# Patient Record
Sex: Female | Born: 1950 | Race: White | Hispanic: No | Smoking: Never smoker
Health system: Southern US, Community
[De-identification: ages and names within clinical notes are randomized; demographics above are authoritative.]

## PROBLEM LIST (undated history)

## (undated) DIAGNOSIS — R51 Headache: Secondary | ICD-10-CM

## (undated) DIAGNOSIS — F4321 Adjustment disorder with depressed mood: Secondary | ICD-10-CM

## (undated) DIAGNOSIS — D649 Anemia, unspecified: Secondary | ICD-10-CM

## (undated) DIAGNOSIS — K589 Irritable bowel syndrome without diarrhea: Secondary | ICD-10-CM

## (undated) DIAGNOSIS — R7303 Prediabetes: Secondary | ICD-10-CM

## (undated) DIAGNOSIS — C4441 Basal cell carcinoma of skin of scalp and neck: Secondary | ICD-10-CM

## (undated) DIAGNOSIS — J302 Other seasonal allergic rhinitis: Secondary | ICD-10-CM

## (undated) DIAGNOSIS — Z8739 Personal history of other diseases of the musculoskeletal system and connective tissue: Secondary | ICD-10-CM

## (undated) DIAGNOSIS — K581 Irritable bowel syndrome with constipation: Secondary | ICD-10-CM

## (undated) DIAGNOSIS — E785 Hyperlipidemia, unspecified: Secondary | ICD-10-CM

## (undated) DIAGNOSIS — K219 Gastro-esophageal reflux disease without esophagitis: Secondary | ICD-10-CM

## (undated) DIAGNOSIS — I10 Essential (primary) hypertension: Secondary | ICD-10-CM

## (undated) DIAGNOSIS — H3530 Unspecified macular degeneration: Secondary | ICD-10-CM

## (undated) DIAGNOSIS — R519 Headache, unspecified: Secondary | ICD-10-CM

## (undated) DIAGNOSIS — R011 Cardiac murmur, unspecified: Secondary | ICD-10-CM

## (undated) DIAGNOSIS — C539 Malignant neoplasm of cervix uteri, unspecified: Secondary | ICD-10-CM

## (undated) HISTORY — PX: BASAL CELL CARCINOMA EXCISION: SHX1214

## (undated) HISTORY — PX: EYE SURGERY: SHX253

## (undated) HISTORY — PX: COLONOSCOPY: SHX174

## (undated) HISTORY — PX: VAGINAL HYSTERECTOMY: SUR661

## (undated) HISTORY — PX: DILATION AND CURETTAGE OF UTERUS: SHX78

---

## 2005-04-18 ENCOUNTER — Ambulatory Visit: Payer: Self-pay | Admitting: Gastroenterology

## 2005-05-10 ENCOUNTER — Ambulatory Visit: Payer: Self-pay | Admitting: Gastroenterology

## 2005-11-20 ENCOUNTER — Ambulatory Visit: Payer: Self-pay | Admitting: Internal Medicine

## 2006-11-27 ENCOUNTER — Ambulatory Visit: Payer: Self-pay | Admitting: Internal Medicine

## 2007-12-17 ENCOUNTER — Ambulatory Visit: Payer: Self-pay | Admitting: Internal Medicine

## 2008-06-18 ENCOUNTER — Ambulatory Visit: Payer: Self-pay | Admitting: Internal Medicine

## 2008-06-24 ENCOUNTER — Ambulatory Visit: Payer: Self-pay | Admitting: Internal Medicine

## 2008-12-22 ENCOUNTER — Ambulatory Visit: Payer: Self-pay | Admitting: Internal Medicine

## 2010-01-30 ENCOUNTER — Ambulatory Visit: Payer: Self-pay | Admitting: Internal Medicine

## 2010-02-09 ENCOUNTER — Ambulatory Visit: Payer: Self-pay | Admitting: Internal Medicine

## 2011-03-15 ENCOUNTER — Ambulatory Visit: Payer: Self-pay | Admitting: Internal Medicine

## 2013-07-13 ENCOUNTER — Ambulatory Visit: Payer: Self-pay | Admitting: Internal Medicine

## 2014-12-02 DIAGNOSIS — E7849 Other hyperlipidemia: Secondary | ICD-10-CM | POA: Insufficient documentation

## 2014-12-02 DIAGNOSIS — I10 Essential (primary) hypertension: Secondary | ICD-10-CM | POA: Insufficient documentation

## 2015-01-06 ENCOUNTER — Other Ambulatory Visit: Payer: Self-pay | Admitting: Internal Medicine

## 2015-01-06 DIAGNOSIS — Z1231 Encounter for screening mammogram for malignant neoplasm of breast: Secondary | ICD-10-CM

## 2015-01-21 ENCOUNTER — Ambulatory Visit: Admission: RE | Admit: 2015-01-21 | Payer: Self-pay | Source: Ambulatory Visit

## 2015-04-29 ENCOUNTER — Ambulatory Visit
Admission: RE | Admit: 2015-04-29 | Discharge: 2015-04-29 | Disposition: A | Discharge status: Home / Self Care | Payer: BLUE CROSS/BLUE SHIELD | Source: Ambulatory Visit | Attending: Internal Medicine | Admitting: Internal Medicine

## 2015-04-29 DIAGNOSIS — Z1231 Encounter for screening mammogram for malignant neoplasm of breast: Secondary | ICD-10-CM | POA: Diagnosis not present

## 2015-10-04 ENCOUNTER — Encounter (INDEPENDENT_AMBULATORY_CARE_PROVIDER_SITE_OTHER): Payer: BLUE CROSS/BLUE SHIELD | Admitting: Ophthalmology

## 2015-10-04 DIAGNOSIS — H353131 Nonexudative age-related macular degeneration, bilateral, early dry stage: Secondary | ICD-10-CM | POA: Diagnosis not present

## 2015-10-04 DIAGNOSIS — H35342 Macular cyst, hole, or pseudohole, left eye: Secondary | ICD-10-CM

## 2015-10-04 DIAGNOSIS — I10 Essential (primary) hypertension: Secondary | ICD-10-CM | POA: Diagnosis not present

## 2015-10-04 DIAGNOSIS — H2513 Age-related nuclear cataract, bilateral: Secondary | ICD-10-CM

## 2015-10-04 DIAGNOSIS — H35373 Puckering of macula, bilateral: Secondary | ICD-10-CM

## 2015-10-04 DIAGNOSIS — H43813 Vitreous degeneration, bilateral: Secondary | ICD-10-CM

## 2015-10-04 DIAGNOSIS — H35033 Hypertensive retinopathy, bilateral: Secondary | ICD-10-CM | POA: Diagnosis not present

## 2015-10-05 NOTE — H&P (Signed)
Julia Noble is an 65 y.o. female.   Chief Complaint:Painless loss of vision left eye HPI: Progressive vision loss left eye over two months  Past Medical History  Diagnosis Date  . Cancer     cervical ca    Past Surgical History  Procedure Laterality Date  . Abdominal hysterectomy      Family History  Problem Relation Age of Onset  . Breast cancer Mother 3   Social History:  has no tobacco, alcohol, and drug history on file.  Allergies: Allergies not on file  No prescriptions prior to admission    Review of systems otherwise negative  There were no vitals taken for this visit.  Physical exam: Mental status: oriented x3. Eyes: See eye exam associated with this date of surgery in media tab.  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  [name] 10/05/2015, 7:18 AM

## 2015-10-12 ENCOUNTER — Encounter (INDEPENDENT_AMBULATORY_CARE_PROVIDER_SITE_OTHER): Payer: Self-pay | Admitting: Ophthalmology

## 2015-10-31 ENCOUNTER — Encounter (HOSPITAL_COMMUNITY): Payer: Self-pay | Admitting: *Deleted

## 2015-10-31 NOTE — Progress Notes (Signed)
Pt states she had a heart murmur when she was younger, never had any problems with it. Denies recent chest pain or sob.

## 2015-11-01 ENCOUNTER — Encounter (HOSPITAL_COMMUNITY): Payer: Self-pay | Admitting: General Practice

## 2015-11-01 ENCOUNTER — Ambulatory Visit (HOSPITAL_COMMUNITY): Payer: BLUE CROSS/BLUE SHIELD | Admitting: Anesthesiology

## 2015-11-01 ENCOUNTER — Encounter (HOSPITAL_COMMUNITY)
Admission: RE | Disposition: A | Discharge status: Home / Self Care | Payer: Self-pay | Source: Ambulatory Visit | Attending: Ophthalmology

## 2015-11-01 ENCOUNTER — Ambulatory Visit (HOSPITAL_COMMUNITY)
Admission: RE | Admit: 2015-11-01 | Discharge: 2015-11-02 | Disposition: A | Discharge status: Home / Self Care | Payer: BLUE CROSS/BLUE SHIELD | Source: Ambulatory Visit | Attending: Ophthalmology | Admitting: Ophthalmology

## 2015-11-01 DIAGNOSIS — K219 Gastro-esophageal reflux disease without esophagitis: Secondary | ICD-10-CM | POA: Diagnosis not present

## 2015-11-01 DIAGNOSIS — I10 Essential (primary) hypertension: Secondary | ICD-10-CM | POA: Diagnosis not present

## 2015-11-01 DIAGNOSIS — M1990 Unspecified osteoarthritis, unspecified site: Secondary | ICD-10-CM | POA: Insufficient documentation

## 2015-11-01 DIAGNOSIS — F329 Major depressive disorder, single episode, unspecified: Secondary | ICD-10-CM | POA: Insufficient documentation

## 2015-11-01 DIAGNOSIS — H35342 Macular cyst, hole, or pseudohole, left eye: Secondary | ICD-10-CM | POA: Diagnosis not present

## 2015-11-01 DIAGNOSIS — Z79899 Other long term (current) drug therapy: Secondary | ICD-10-CM | POA: Insufficient documentation

## 2015-11-01 DIAGNOSIS — H35349 Macular cyst, hole, or pseudohole, unspecified eye: Secondary | ICD-10-CM | POA: Diagnosis present

## 2015-11-01 HISTORY — DX: Cardiac murmur, unspecified: R01.1

## 2015-11-01 HISTORY — PX: MEMBRANE PEEL: SHX5967

## 2015-11-01 HISTORY — DX: Hyperlipidemia, unspecified: E78.5

## 2015-11-01 HISTORY — DX: Malignant neoplasm of cervix uteri, unspecified: C53.9

## 2015-11-01 HISTORY — DX: Irritable bowel syndrome, unspecified: K58.9

## 2015-11-01 HISTORY — DX: Unspecified macular degeneration: H35.30

## 2015-11-01 HISTORY — PX: 25 GAUGE PARS PLANA VITRECTOMY WITH 20 GAUGE MVR PORT FOR MACULAR HOLE: SHX6096

## 2015-11-01 HISTORY — DX: Adjustment disorder with depressed mood: F43.21

## 2015-11-01 HISTORY — DX: Headache: R51

## 2015-11-01 HISTORY — DX: Headache, unspecified: R51.9

## 2015-11-01 HISTORY — PX: PARS PLANA VITRECTOMY: SHX2166

## 2015-11-01 HISTORY — DX: Gastro-esophageal reflux disease without esophagitis: K21.9

## 2015-11-01 HISTORY — DX: Anemia, unspecified: D64.9

## 2015-11-01 HISTORY — DX: Basal cell carcinoma of skin of scalp and neck: C44.41

## 2015-11-01 HISTORY — DX: Essential (primary) hypertension: I10

## 2015-11-01 HISTORY — DX: Personal history of other diseases of the musculoskeletal system and connective tissue: Z87.39

## 2015-11-01 LAB — AUTOLOGOUS SERUM PATCH PREP

## 2015-11-01 LAB — BASIC METABOLIC PANEL
Anion gap: 11 (ref 5–15)
BUN: 18 mg/dL (ref 6–20)
CALCIUM: 9.7 mg/dL (ref 8.9–10.3)
CO2: 27 mmol/L (ref 22–32)
CREATININE: 0.83 mg/dL (ref 0.44–1.00)
Chloride: 102 mmol/L (ref 101–111)
Glucose, Bld: 101 mg/dL — ABNORMAL HIGH (ref 65–99)
Potassium: 3.3 mmol/L — ABNORMAL LOW (ref 3.5–5.1)
SODIUM: 140 mmol/L (ref 135–145)

## 2015-11-01 LAB — ABO/RH: ABO/RH(D): O POS

## 2015-11-01 SURGERY — 25 GAUGE PARS PLANA VITRECTOMY WITH 20 GAUGE MVR PORT FOR MACULAR HOLE
Anesthesia: General | Site: Eye | Laterality: Left

## 2015-11-01 MED ORDER — TROPICAMIDE 1 % OP SOLN
OPHTHALMIC | Status: AC
  Filled 2015-11-01: qty 3

## 2015-11-01 MED ORDER — 0.9 % SODIUM CHLORIDE (POUR BTL) OPTIME
TOPICAL | Status: DC | PRN
  Administered 2015-11-01: 200 mL

## 2015-11-01 MED ORDER — LIDOCAINE HCL (CARDIAC) 20 MG/ML IV SOLN
INTRAVENOUS | Status: AC
  Filled 2015-11-01: qty 5

## 2015-11-01 MED ORDER — IBUPROFEN 200 MG PO TABS: 200.0000 mg | ORAL_TABLET | Freq: Every day | ORAL | Status: DC | PRN

## 2015-11-01 MED ORDER — CEFTAZIDIME 1 G IJ SOLR: INTRAMUSCULAR | Status: DC | PRN

## 2015-11-01 MED ORDER — EPINEPHRINE HCL 1 MG/ML IJ SOLN
INTRAOCULAR | Status: DC | PRN
  Administered 2015-11-01: 11:00:00

## 2015-11-01 MED ORDER — ROCURONIUM BROMIDE 100 MG/10ML IV SOLN
INTRAVENOUS | Status: DC | PRN
  Administered 2015-11-01: 30 mg via INTRAVENOUS
  Administered 2015-11-01: 5 mg via INTRAVENOUS

## 2015-11-01 MED ORDER — GATIFLOXACIN 0.5 % OP SOLN
1.0000 [drp] | Freq: Four times a day (QID) | OPHTHALMIC | Status: DC
  Filled 2015-11-01: qty 2.5

## 2015-11-01 MED ORDER — PROPOFOL 10 MG/ML IV BOLUS
INTRAVENOUS | Status: AC
  Filled 2015-11-01: qty 20

## 2015-11-01 MED ORDER — PREDNISOLONE ACETATE 1 % OP SUSP
1.0000 [drp] | Freq: Four times a day (QID) | OPHTHALMIC | Status: DC
  Filled 2015-11-01: qty 5

## 2015-11-01 MED ORDER — NAPROXEN SODIUM 275 MG PO TABS: 275.0000 mg | ORAL_TABLET | Freq: Every day | ORAL | Status: DC | PRN

## 2015-11-01 MED ORDER — BACITRACIN-POLYMYXIN B 500-10000 UNIT/GM OP OINT
1.0000 "application " | TOPICAL_OINTMENT | Freq: Three times a day (TID) | OPHTHALMIC | Status: DC
  Filled 2015-11-01: qty 3.5

## 2015-11-01 MED ORDER — ACETAMINOPHEN 500 MG PO TABS: 500.0000 mg | ORAL_TABLET | Freq: Every day | ORAL | Status: DC | PRN

## 2015-11-01 MED ORDER — SERTRALINE HCL 50 MG PO TABS: 50.0000 mg | ORAL_TABLET | ORAL | Status: DC

## 2015-11-01 MED ORDER — CEFAZOLIN SODIUM-DEXTROSE 2-3 GM-% IV SOLR
2.0000 g | INTRAVENOUS | Status: AC
  Administered 2015-11-01: 2 g via INTRAVENOUS
  Filled 2015-11-01: qty 50

## 2015-11-01 MED ORDER — PHENYLEPHRINE HCL 2.5 % OP SOLN: 1.0000 [drp] | OPHTHALMIC | Status: DC | PRN

## 2015-11-01 MED ORDER — GATIFLOXACIN 0.5 % OP SOLN: 1.0000 [drp] | OPHTHALMIC | Status: DC | PRN

## 2015-11-01 MED ORDER — MIDAZOLAM HCL 2 MG/2ML IJ SOLN
INTRAMUSCULAR | Status: DC | PRN
  Administered 2015-11-01: 2 mg via INTRAVENOUS

## 2015-11-01 MED ORDER — CYCLOPENTOLATE HCL 1 % OP SOLN
OPHTHALMIC | Status: AC
  Filled 2015-11-01: qty 2

## 2015-11-01 MED ORDER — LIDOCAINE HCL (CARDIAC) 20 MG/ML IV SOLN
INTRAVENOUS | Status: DC | PRN
  Administered 2015-11-01: 30 mg via INTRAVENOUS

## 2015-11-01 MED ORDER — POLYMYXIN B SULFATE 500000 UNITS IJ SOLR
INTRAMUSCULAR | Status: AC
  Filled 2015-11-01: qty 1

## 2015-11-01 MED ORDER — GATIFLOXACIN 0.5 % OP SOLN
1.0000 [drp] | OPHTHALMIC | Status: AC
  Administered 2015-11-01 (×3): 1 [drp] via OPHTHALMIC

## 2015-11-01 MED ORDER — AMLODIPINE BESYLATE 5 MG PO TABS
5.0000 mg | ORAL_TABLET | Freq: Every day | ORAL | Status: DC
  Administered 2015-11-01: 5 mg via ORAL
  Filled 2015-11-01: qty 1

## 2015-11-01 MED ORDER — HYDROMORPHONE HCL 1 MG/ML IJ SOLN: 0.2500 mg | INTRAMUSCULAR | Status: DC | PRN

## 2015-11-01 MED ORDER — CEFTAZIDIME 1 G IJ SOLR
INTRAMUSCULAR | Status: AC
  Filled 2015-11-01: qty 1

## 2015-11-01 MED ORDER — PHENYLEPHRINE HCL 10 MG/ML IJ SOLN
INTRAMUSCULAR | Status: DC | PRN
  Administered 2015-11-01: 40 ug via INTRAVENOUS
  Administered 2015-11-01: 80 ug via INTRAVENOUS
  Administered 2015-11-01: 40 ug via INTRAVENOUS
  Administered 2015-11-01 (×3): 80 ug via INTRAVENOUS

## 2015-11-01 MED ORDER — SUGAMMADEX SODIUM 200 MG/2ML IV SOLN
INTRAVENOUS | Status: AC
  Filled 2015-11-01: qty 2

## 2015-11-01 MED ORDER — EPINEPHRINE HCL 1 MG/ML IJ SOLN
INTRAMUSCULAR | Status: AC
  Filled 2015-11-01: qty 1

## 2015-11-01 MED ORDER — ACETAMINOPHEN 325 MG PO TABS: 325.0000 mg | ORAL_TABLET | ORAL | Status: DC | PRN

## 2015-11-01 MED ORDER — BRIMONIDINE TARTRATE 0.2 % OP SOLN
1.0000 [drp] | Freq: Two times a day (BID) | OPHTHALMIC | Status: DC
  Filled 2015-11-01: qty 5

## 2015-11-01 MED ORDER — ADULT MULTIVITAMIN W/MINERALS CH: 1.0000 | ORAL_TABLET | Freq: Every day | ORAL | Status: DC

## 2015-11-01 MED ORDER — ONDANSETRON HCL 4 MG/2ML IJ SOLN
INTRAMUSCULAR | Status: AC
  Filled 2015-11-01: qty 2

## 2015-11-01 MED ORDER — ACETAZOLAMIDE SODIUM 500 MG IJ SOLR
500.0000 mg | Freq: Once | INTRAMUSCULAR | Status: AC
  Administered 2015-11-02: 500 mg via INTRAVENOUS
  Filled 2015-11-01: qty 500

## 2015-11-01 MED ORDER — SODIUM CHLORIDE 0.9 % IV SOLN
INTRAVENOUS | Status: DC
  Administered 2015-11-01 (×2): via INTRAVENOUS

## 2015-11-01 MED ORDER — PANTOPRAZOLE SODIUM 40 MG PO TBEC: 40.0000 mg | DELAYED_RELEASE_TABLET | Freq: Every day | ORAL | Status: DC

## 2015-11-01 MED ORDER — FENTANYL CITRATE (PF) 250 MCG/5ML IJ SOLN
INTRAMUSCULAR | Status: AC
  Filled 2015-11-01: qty 5

## 2015-11-01 MED ORDER — PHENYLEPHRINE 40 MCG/ML (10ML) SYRINGE FOR IV PUSH (FOR BLOOD PRESSURE SUPPORT)
PREFILLED_SYRINGE | INTRAVENOUS | Status: AC
  Filled 2015-11-01: qty 10

## 2015-11-01 MED ORDER — CYCLOPENTOLATE HCL 1 % OP SOLN
1.0000 [drp] | OPHTHALMIC | Status: AC
  Administered 2015-11-01 (×3): 1 [drp] via OPHTHALMIC

## 2015-11-01 MED ORDER — SIMVASTATIN 20 MG PO TABS
20.0000 mg | ORAL_TABLET | Freq: Every day | ORAL | Status: DC
  Administered 2015-11-01: 20 mg via ORAL
  Filled 2015-11-01: qty 1

## 2015-11-01 MED ORDER — PROPOFOL 10 MG/ML IV BOLUS
INTRAVENOUS | Status: DC | PRN
  Administered 2015-11-01: 160 mg via INTRAVENOUS

## 2015-11-01 MED ORDER — MIDAZOLAM HCL 2 MG/2ML IJ SOLN
INTRAMUSCULAR | Status: AC
  Filled 2015-11-01: qty 2

## 2015-11-01 MED ORDER — TEMAZEPAM 15 MG PO CAPS: 15.0000 mg | ORAL_CAPSULE | Freq: Every evening | ORAL | Status: DC | PRN

## 2015-11-01 MED ORDER — ATROPINE SULFATE 1 % OP SOLN
OPHTHALMIC | Status: DC | PRN
  Administered 2015-11-01: 1 [drp] via OPHTHALMIC

## 2015-11-01 MED ORDER — CYCLOPENTOLATE HCL 1 % OP SOLN: 1.0000 [drp] | OPHTHALMIC | Status: DC | PRN

## 2015-11-01 MED ORDER — BUPIVACAINE HCL (PF) 0.75 % IJ SOLN
INTRAMUSCULAR | Status: AC
  Filled 2015-11-01: qty 10

## 2015-11-01 MED ORDER — SODIUM HYALURONATE 10 MG/ML IO SOLN
INTRAOCULAR | Status: AC
Start: 2015-11-01 — End: 2015-11-01
  Filled 2015-11-01: qty 0.85

## 2015-11-01 MED ORDER — HYDROCODONE-ACETAMINOPHEN 5-325 MG PO TABS
1.0000 | ORAL_TABLET | ORAL | Status: DC | PRN
  Administered 2015-11-01: 2 via ORAL
  Filled 2015-11-01: qty 2

## 2015-11-01 MED ORDER — STERILE WATER FOR INJECTION IJ SOLN
INTRAMUSCULAR | Status: AC
  Filled 2015-11-01: qty 20

## 2015-11-01 MED ORDER — TETRACAINE HCL 0.5 % OP SOLN
2.0000 [drp] | Freq: Once | OPHTHALMIC | Status: DC
  Filled 2015-11-01: qty 2

## 2015-11-01 MED ORDER — ATROPINE SULFATE 1 % OP SOLN
OPHTHALMIC | Status: AC
  Filled 2015-11-01: qty 5

## 2015-11-01 MED ORDER — ROCURONIUM BROMIDE 50 MG/5ML IV SOLN
INTRAVENOUS | Status: AC
  Filled 2015-11-01: qty 1

## 2015-11-01 MED ORDER — STERILE WATER FOR INJECTION IJ SOLN
INTRAMUSCULAR | Status: DC | PRN
  Administered 2015-11-01: 20 mL

## 2015-11-01 MED ORDER — BSS PLUS IO SOLN
INTRAOCULAR | Status: AC
  Filled 2015-11-01: qty 500

## 2015-11-01 MED ORDER — HYPROMELLOSE (GONIOSCOPIC) 2.5 % OP SOLN
OPHTHALMIC | Status: AC
  Filled 2015-11-01: qty 15

## 2015-11-01 MED ORDER — LATANOPROST 0.005 % OP SOLN
1.0000 [drp] | Freq: Every day | OPHTHALMIC | Status: DC
  Filled 2015-11-01 (×2): qty 2.5

## 2015-11-01 MED ORDER — FENTANYL CITRATE (PF) 250 MCG/5ML IJ SOLN
INTRAMUSCULAR | Status: DC | PRN
  Administered 2015-11-01: 50 ug via INTRAVENOUS

## 2015-11-01 MED ORDER — SODIUM HYALURONATE 10 MG/ML IO SOLN
INTRAOCULAR | Status: DC | PRN
  Administered 2015-11-01: 0.85 mL via INTRAOCULAR

## 2015-11-01 MED ORDER — SODIUM CHLORIDE 0.45 % IV SOLN
INTRAVENOUS | Status: DC
  Administered 2015-11-01: 100 mL/h via INTRAVENOUS

## 2015-11-01 MED ORDER — PHENYLEPHRINE HCL 2.5 % OP SOLN
OPHTHALMIC | Status: AC
  Filled 2015-11-01: qty 2

## 2015-11-01 MED ORDER — GLYCOPYRROLATE 0.2 MG/ML IJ SOLN
INTRAMUSCULAR | Status: DC | PRN
  Administered 2015-11-01: 0.2 mg via INTRAVENOUS

## 2015-11-01 MED ORDER — TROPICAMIDE 1 % OP SOLN
1.0000 [drp] | OPHTHALMIC | Status: AC
  Administered 2015-11-01 (×3): 1 [drp] via OPHTHALMIC

## 2015-11-01 MED ORDER — BSS IO SOLN
INTRAOCULAR | Status: AC
  Filled 2015-11-01: qty 15

## 2015-11-01 MED ORDER — ALPRAZOLAM 0.5 MG PO TABS: 0.5000 mg | ORAL_TABLET | Freq: Every day | ORAL | Status: DC | PRN

## 2015-11-01 MED ORDER — TRIAMTERENE-HCTZ 37.5-25 MG PO TABS
1.0000 | ORAL_TABLET | Freq: Every day | ORAL | Status: DC
  Administered 2015-11-01: 1 via ORAL
  Filled 2015-11-01 (×4): qty 1

## 2015-11-01 MED ORDER — BACITRACIN-POLYMYXIN B 500-10000 UNIT/GM OP OINT
TOPICAL_OINTMENT | OPHTHALMIC | Status: AC
  Filled 2015-11-01: qty 3.5

## 2015-11-01 MED ORDER — PHENYLEPHRINE HCL 2.5 % OP SOLN
1.0000 [drp] | OPHTHALMIC | Status: AC
  Administered 2015-11-01 (×3): 1 [drp] via OPHTHALMIC

## 2015-11-01 MED ORDER — HEMOSTATIC AGENTS (NO CHARGE) OPTIME
TOPICAL | Status: DC | PRN
  Administered 2015-11-01: 1 via TOPICAL

## 2015-11-01 MED ORDER — DEXAMETHASONE SODIUM PHOSPHATE 10 MG/ML IJ SOLN
INTRAMUSCULAR | Status: DC | PRN
  Administered 2015-11-01: 10 mg

## 2015-11-01 MED ORDER — DEXAMETHASONE SODIUM PHOSPHATE 10 MG/ML IJ SOLN
INTRAMUSCULAR | Status: AC
  Filled 2015-11-01: qty 1

## 2015-11-01 MED ORDER — MORPHINE SULFATE (PF) 2 MG/ML IV SOLN: 1.0000 mg | INTRAVENOUS | Status: DC | PRN

## 2015-11-01 MED ORDER — TRIAMCINOLONE ACETONIDE 40 MG/ML IJ SUSP
INTRAMUSCULAR | Status: AC
  Filled 2015-11-01: qty 5

## 2015-11-01 MED ORDER — MAGNESIUM HYDROXIDE 400 MG/5ML PO SUSP: 15.0000 mL | Freq: Four times a day (QID) | ORAL | Status: DC | PRN

## 2015-11-01 MED ORDER — DOUBLE ANTIBIOTIC 500-10000 UNIT/GM EX OINT
TOPICAL_OINTMENT | CUTANEOUS | Status: AC
  Filled 2015-11-01: qty 1

## 2015-11-01 MED ORDER — GATIFLOXACIN 0.5 % OP SOLN
OPHTHALMIC | Status: AC
  Filled 2015-11-01: qty 2.5

## 2015-11-01 MED ORDER — SUGAMMADEX SODIUM 200 MG/2ML IV SOLN
INTRAVENOUS | Status: DC | PRN
  Administered 2015-11-01: 130 mg via INTRAVENOUS

## 2015-11-01 MED ORDER — BUPIVACAINE HCL (PF) 0.75 % IJ SOLN
INTRAMUSCULAR | Status: DC | PRN
Start: 2015-11-01 — End: 2015-11-01
  Administered 2015-11-01: 10 mL

## 2015-11-01 MED ORDER — BUPIVACAINE-EPINEPHRINE (PF) 0.25% -1:200000 IJ SOLN
INTRAMUSCULAR | Status: AC
  Filled 2015-11-01: qty 30

## 2015-11-01 MED ORDER — TROPICAMIDE 1 % OP SOLN: 1.0000 [drp] | OPHTHALMIC | Status: DC | PRN

## 2015-11-01 MED ORDER — BACITRACIN-POLYMYXIN B 500-10000 UNIT/GM OP OINT
TOPICAL_OINTMENT | OPHTHALMIC | Status: DC | PRN
  Administered 2015-11-01: 1 via OPHTHALMIC

## 2015-11-01 MED ORDER — STERILE WATER FOR IRRIGATION IR SOLN
Status: DC | PRN
  Administered 2015-11-01: 200 mL

## 2015-11-01 MED ORDER — ONDANSETRON HCL 4 MG/2ML IJ SOLN
INTRAMUSCULAR | Status: DC | PRN
  Administered 2015-11-01: 4 mg via INTRAVENOUS

## 2015-11-01 MED ORDER — SODIUM CHLORIDE 0.9 % IJ SOLN
INTRAMUSCULAR | Status: AC
  Filled 2015-11-01: qty 10

## 2015-11-01 MED ORDER — LISINOPRIL 20 MG PO TABS
20.0000 mg | ORAL_TABLET | Freq: Every day | ORAL | Status: DC
  Administered 2015-11-01: 20 mg via ORAL
  Filled 2015-11-01: qty 1

## 2015-11-01 MED ORDER — ONDANSETRON HCL 4 MG/2ML IJ SOLN
4.0000 mg | Freq: Four times a day (QID) | INTRAMUSCULAR | Status: DC
  Administered 2015-11-01 – 2015-11-02 (×2): 4 mg via INTRAVENOUS
  Filled 2015-11-01 (×2): qty 2

## 2015-11-01 SURGICAL SUPPLY — 62 items
BLADE EYE CATARACT 19 1.4 BEAV (BLADE) IMPLANT
BLADE MVR KNIFE 19G (BLADE) IMPLANT
BLADE MVR KNIFE 20G (BLADE) ×3 IMPLANT
CANNULA VLV SOFT TIP 25GA (OPHTHALMIC) ×3 IMPLANT
CORDS BIPOLAR (ELECTRODE) ×3 IMPLANT
COTTONBALL LRG STERILE PKG (GAUZE/BANDAGES/DRESSINGS) ×9 IMPLANT
COVER MAYO STAND STRL (DRAPES) IMPLANT
DRAPE INCISE 51X51 W/FILM STRL (DRAPES) IMPLANT
DRAPE OPHTHALMIC 77X100 STRL (CUSTOM PROCEDURE TRAY) ×3 IMPLANT
ERASER HMR WETFIELD 23G BP (MISCELLANEOUS) ×3 IMPLANT
FILTER BLUE MILLIPORE (MISCELLANEOUS) IMPLANT
FILTER STRAW FLUID ASPIR (MISCELLANEOUS) ×3 IMPLANT
FORCEPS GRIESHABER ILM 25G A (INSTRUMENTS) ×3 IMPLANT
GAS AUTO FILL CONSTEL (OPHTHALMIC) ×3
GAS AUTO FILL CONSTELLATION (OPHTHALMIC) ×1 IMPLANT
GAS OPHTHALMIC (MISCELLANEOUS) ×3 IMPLANT
GLOVE SS BIOGEL STRL SZ 6.5 (GLOVE) ×1 IMPLANT
GLOVE SS BIOGEL STRL SZ 7 (GLOVE) ×1 IMPLANT
GLOVE SUPERSENSE BIOGEL SZ 6.5 (GLOVE) ×2
GLOVE SUPERSENSE BIOGEL SZ 7 (GLOVE) ×2
GLOVE SURG 8.5 LATEX PF (GLOVE) ×3 IMPLANT
GLOVE SURG SS PI 6.5 STRL IVOR (GLOVE) ×6 IMPLANT
GOWN STRL REUS W/ TWL LRG LVL3 (GOWN DISPOSABLE) ×4 IMPLANT
GOWN STRL REUS W/TWL LRG LVL3 (GOWN DISPOSABLE) ×8
HANDLE PNEUMATIC FOR CONSTEL (OPHTHALMIC) ×3 IMPLANT
KIT BASIN OR (CUSTOM PROCEDURE TRAY) ×3 IMPLANT
KNIFE GRIESHABER SHARP 2.5MM (MISCELLANEOUS) IMPLANT
MICROPICK 25G (MISCELLANEOUS)
NEEDLE 18GX1X1/2 (RX/OR ONLY) (NEEDLE) ×3 IMPLANT
NEEDLE 25GX 5/8IN NON SAFETY (NEEDLE) ×3 IMPLANT
NEEDLE FILTER BLUNT 18X 1/2SAF (NEEDLE) ×2
NEEDLE FILTER BLUNT 18X1 1/2 (NEEDLE) ×1 IMPLANT
NEEDLE HYPO 30X.5 LL (NEEDLE) IMPLANT
NS IRRIG 1000ML POUR BTL (IV SOLUTION) ×3 IMPLANT
PACK FRAGMATOME (OPHTHALMIC) IMPLANT
PACK VITRECTOMY CUSTOM (CUSTOM PROCEDURE TRAY) ×3 IMPLANT
PAD ARMBOARD 7.5X6 YLW CONV (MISCELLANEOUS) ×3 IMPLANT
PAK PIK VITRECTOMY CVS 25GA (OPHTHALMIC) ×3 IMPLANT
PIC ILLUMINATED 25G (OPHTHALMIC) ×3
PICK MICROPICK 25G (MISCELLANEOUS) IMPLANT
PIK ILLUMINATED 25G (OPHTHALMIC) ×1 IMPLANT
PROBE LASER ILLUM FLEX CVD 25G (OPHTHALMIC) IMPLANT
REPL STRA BRUSH NEEDLE (NEEDLE) ×3 IMPLANT
RESERVOIR BACK FLUSH (MISCELLANEOUS) ×3 IMPLANT
ROLLS DENTAL (MISCELLANEOUS) ×6 IMPLANT
SCRAPER DIAMOND 25GA (OPHTHALMIC RELATED) IMPLANT
SCRAPER DIAMOND DUST MEMBRANE (MISCELLANEOUS) ×3 IMPLANT
SPONGE SURGIFOAM ABS GEL 12-7 (HEMOSTASIS) ×3 IMPLANT
STOPCOCK 4 WAY LG BORE MALE ST (IV SETS) IMPLANT
SUT CHROMIC 7 0 TG140 8 (SUTURE) IMPLANT
SUT ETHILON 9 0 TG140 8 (SUTURE) ×3 IMPLANT
SUT POLY NON ABSORB 10-0 8 STR (SUTURE) IMPLANT
SUT SILK 4 0 RB 1 (SUTURE) IMPLANT
SUT VICRYL 7 0 TG140 8 (SUTURE) ×3 IMPLANT
SYR 20CC LL (SYRINGE) ×6 IMPLANT
SYR 5ML LL (SYRINGE) IMPLANT
SYR BULB 3OZ (MISCELLANEOUS) ×3 IMPLANT
SYR TB 1ML LUER SLIP (SYRINGE) ×3 IMPLANT
SYRINGE 10CC LL (SYRINGE) IMPLANT
TUBING HIGH PRESS EXTEN 6IN (TUBING) ×3 IMPLANT
WATER STERILE IRR 1000ML POUR (IV SOLUTION) ×3 IMPLANT
WIPE INSTRUMENT VISIWIPE 73X73 (MISCELLANEOUS) IMPLANT

## 2015-11-01 NOTE — Anesthesia Procedure Notes (Signed)
Procedure Name: Intubation Date/Time: 11/01/2015 11:38 AM Performed by: Sampson Si E Pre-anesthesia Checklist: Patient identified, Emergency Drugs available, Suction available, Patient being monitored and Timeout performed Patient Re-evaluated:Patient Re-evaluated prior to inductionOxygen Delivery Method: Circle system utilized Preoxygenation: Pre-oxygenation with 100% oxygen Intubation Type: IV induction Ventilation: Mask ventilation without difficulty Laryngoscope Size: Mac and 3 Grade View: Grade I Tube type: Oral Tube size: 7.0 mm Number of attempts: 1 Airway Equipment and Method: Stylet Placement Confirmation: ETT inserted through vocal cords under direct vision,  positive ETCO2 and breath sounds checked- equal and bilateral Secured at: 21 cm Tube secured with: Tape Dental Injury: Teeth and Oropharynx as per pre-operative assessment

## 2015-11-01 NOTE — Anesthesia Postprocedure Evaluation (Signed)
Anesthesia Post Note  Patient: Julia Noble  Procedure(s) Performed: Procedure(s) (LRB): LEFT EYE 25 GAUGE PARS PLANA VITRECTOMY WITH 20 GAUGE MVR PORT FOR MACULAR HOLE; INSERTION OF C3F8; HEADSCOPE LASER; SERUM PATCH  (Left) MEMBRANE PEEL LEFT EYE (Left)  Patient location during evaluation: PACU Anesthesia Type: General Level of consciousness: awake and alert Pain management: pain level controlled Vital Signs Assessment: post-procedure vital signs reviewed and stable Respiratory status: spontaneous breathing, nonlabored ventilation, respiratory function stable and patient connected to nasal cannula oxygen Cardiovascular status: blood pressure returned to baseline and stable Postop Assessment: no signs of nausea or vomiting Anesthetic complications: no    Last Vitals:  Filed Vitals:   11/01/15 1345 11/01/15 1400  BP: 102/56 99/65  Pulse: 78 80  Temp:  36.7 C  Resp: 11 12    Last Pain:  Filed Vitals:   11/01/15 1404  PainSc: 0-No pain                 Winslow Ederer,W. EDMOND

## 2015-11-01 NOTE — Brief Op Note (Signed)
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 C3F8 injection left eye Surgeon:  [name], MD...  Assistant:  Deatra Ina SA    Anesthesia: General  Specimen: none  Estimated blood loss:  1cc  Complications: none  Patient sent to PACU in good condition  Composed by [name] MD  Dictation number: [id number]

## 2015-11-01 NOTE — Op Note (Signed)
NAME:  Julia Noble, Julia Noble NO.:  [id number]  MEDICAL RECORD NO.:  ID:[id number]  LOCATION:  [id number]                        FACILITY:  [place]  PHYSICIAN:  Chrystie Nose. Zigmund Daniel, M.D. DATE OF BIRTH:  Mar 19, 1951  DATE OF PROCEDURE:  11/01/2015 DATE OF DISCHARGE:                              OPERATIVE REPORT   ADMISSION DIAGNOSIS:  Preretinal membrane and macular hole, left eye.  PROCEDURES:  Pars plana vitrectomy, retinal photocoagulation, membrane peel, ILM peel, gas-fluid exchange, serum patch, all in the left eye.  SURGEON:  Chrystie Nose. Zigmund Daniel, M.D.  ASSISTANT:  Deatra Ina, SA.  ANESTHESIA:  General.  DETAILS:  Usual prep and drape, the indirect ophthalmoscope laser was moved into place, 561 burns were placed around the retinal periphery. The power was 500 mW, 1000 microns each and 0.1 seconds each.  Attention was carried to the pars plana area where a 3-layered scleral incision was made after conjunctival peritomy at 2 o'clock.  The 20-gauge MVR was used for incision.  A 25-gauge trocar was placed at 4 and 10 o'clock. The contact lens ring was sutured into place at 6 and 12 o'clock. Methylcellulose was placed on the cornea and the flat contact lens was placed.  Pars plana vitrectomy was begun just behind the cataractous lens, dense white membranes were encountered.  These were carefully removed under low suction and rapid cutting.  The vitrectomy was carried down to the macular surface in a core fashion.  The macular surface was thrown into folds.  The diamond-dusted membrane scraper was used to engage the epiretinal membrane for 360 degrees around the macular hole. The ILM forceps were used to remove the epiretinal membrane and free the edges of the macular hole.  Silicone tip suction line was drawn down to the macular hole area and the fish-strike sign occurred, multiple strands of vitreous were attached to the edge of the macular hole. These were carefully severed  with the lighted pick and the silicone tip suction line for traction.  The vitrectomy was carried into the midperiphery with a 30-degree prismatic lens then into the far periphery for 360 degrees.  All peripheral vitreous was removed.  The magnifying contact lens was placed onto a layer of methylcellulose and onto the corneal surface.  Additional peeling was performed in the macular region.  ILM forceps were used multiple times to strip membranes from the retinal surface.  Once the edges of the macular hole were freed, the vitreous cutter was used to remove vitreous remnants.  Total gas-fluid exchange was carried out.  Sufficient time was allowed to pass so that fluid were tracked down the walls of the eye and collect in the posterior segment.  The serum patch and C3F8 mixture was prepared during this time.  Additional fluid was removed with a [place] ophthalmics brush from the optic nerve head.  The serum patch was delivered.  Access serum was then removed with a [place] ophthalmics brush and all fluid was removed from the vitreous cavity.  Vitreous air was exchanged for intravitreal gas, 14% C3F8.  A 9-0 nylon was used to close the sclerotomy site.  The conjunctiva was closed with wet-field cautery. The 25-gauge  trocars were removed from the eye.  The wounds were tested and found to be secure.  Polymyxin and ceftazidime were injected around the globe for postop pain.  Decadron 10 mg was injected to the lower subconjunctival space.  Atropine solution was applied.  Marcaine was injected around the globe for postop pain.  Polysporin ophthalmic ointment, a patch and shield were placed.  The patient was awakened and taken to recovery in satisfactory condition.  Closing pressure was 10 with a [place] tonometer.  COMPLICATIONS:  None.     Chrystie Nose. Zigmund Daniel, M.D.     JDM/MEDQ  D:  11/01/2015  T:  11/01/2015  Job:  [id number]

## 2015-11-01 NOTE — Progress Notes (Signed)
7:23 pm.  Examined patient at the bedside with slit lamp and tonopen.  Ezra Sites, RN present.  Explained the 'mix-up' with serum patch. Negative HIV explained to patient and husband.  ABO and Rh explained to patient.  Patient "O+" and donor "A+".  No compatability issues according to Dr. Saralyn Pilar in blood bank.  Patient and husband fully informed about the incident.  Eye examination:  Count Fingers vision.  IOP 15mmHg with tonopen.  Slit lamp.  Cornea clear, no cells or flare in [facility]  Lens mild cataract.  Total gas bubble in eye.  Minimal conjunctival edema.  No drainage.  No redness.   Impression:  No adverse reaction seen at this time.  Plan.  Placed more [facility] ointment in eye, repatch.  Patient put face down.  Will recheck in 12 hours.  Tempie Hoist, MD

## 2015-11-01 NOTE — Transfer of Care (Signed)
Immediate Anesthesia Transfer of Care Note  Patient: Julia Noble  Procedure(s) Performed: Procedure(s): LEFT EYE 25 GAUGE PARS PLANA VITRECTOMY WITH 20 GAUGE MVR PORT FOR MACULAR HOLE; INSERTION OF C3F8; HEADSCOPE LASER; SERUM PATCH  (Left) MEMBRANE PEEL LEFT EYE (Left)  Patient Location: PACU  Anesthesia Type:General  Level of Consciousness: lethargic and responds to stimulation  Airway & Oxygen Therapy: Patient Spontanous Breathing and Patient connected to nasal cannula oxygen  Post-op Assessment: Report given to RN  Post vital signs: Reviewed and stable  Last Vitals:  Filed Vitals:   11/01/15 0924  BP: 109/76  Pulse: 64  Temp: 36.8 C  Resp: 20    Complications: No apparent anesthesia complications

## 2015-11-01 NOTE — H&P (Signed)
I examined the patient today and there is no change in the medical status 

## 2015-11-01 NOTE — Anesthesia Preprocedure Evaluation (Addendum)
Anesthesia Evaluation  Patient identified by MRN, date of birth, ID band Patient awake    Reviewed: Allergy & Precautions, H&P , NPO status , Patient's Chart, lab work & pertinent test results  Airway Mallampati: II  TM Distance: >3 FB Neck ROM: Full    Dental no notable dental hx. (+) Upper Dentures, Partial Lower, Dental Advisory Given   Pulmonary neg pulmonary ROS,    Pulmonary exam normal breath sounds clear to auscultation       Cardiovascular hypertension, Pt. on medications  Rhythm:Regular Rate:Normal     Neuro/Psych  Headaches, Depression negative psych ROS   GI/Hepatic Neg liver ROS, GERD  Medicated and Controlled,  Endo/Other  negative endocrine ROS  Renal/GU negative Renal ROS  negative genitourinary   Musculoskeletal  (+) Arthritis , Osteoarthritis,    Abdominal   Peds  Hematology negative hematology ROS (+)   Anesthesia Other Findings   Reproductive/Obstetrics negative OB ROS                            Anesthesia Physical Anesthesia Plan  ASA: II  Anesthesia Plan: General   Post-op Pain Management:    Induction: Intravenous  Airway Management Planned: Oral ETT  Additional Equipment:   Intra-op Plan:   Post-operative Plan: Extubation in OR  Informed Consent: I have reviewed the patients History and Physical, chart, labs and discussed the procedure including the risks, benefits and alternatives for the proposed anesthesia with the patient or authorized representative who has indicated his/her understanding and acceptance.   Dental advisory given  Plan Discussed with: CRNA  Anesthesia Plan Comments:         Anesthesia Quick Evaluation

## 2015-11-02 ENCOUNTER — Encounter (HOSPITAL_COMMUNITY): Payer: Self-pay | Admitting: Ophthalmology

## 2015-11-02 DIAGNOSIS — H35342 Macular cyst, hole, or pseudohole, left eye: Secondary | ICD-10-CM | POA: Diagnosis not present

## 2015-11-02 MED ORDER — TETRACAINE HCL 0.5 % OP SOLN
OPHTHALMIC | Status: AC
  Filled 2015-11-02: qty 2

## 2015-11-02 MED ORDER — BACITRACIN-POLYMYXIN B 500-10000 UNIT/GM OP OINT: 1.0000 "application " | TOPICAL_OINTMENT | Freq: Three times a day (TID) | OPHTHALMIC | Status: DC

## 2015-11-02 MED ORDER — PREDNISOLONE ACETATE 1 % OP SUSP: 1.0000 [drp] | Freq: Four times a day (QID) | OPHTHALMIC | Status: DC

## 2015-11-02 MED ORDER — GATIFLOXACIN 0.5 % OP SOLN: 1.0000 [drp] | Freq: Four times a day (QID) | OPHTHALMIC | Status: DC

## 2015-11-02 MED ORDER — BRIMONIDINE TARTRATE 0.2 % OP SOLN: 1.0000 [drp] | Freq: Two times a day (BID) | OPHTHALMIC | Status: DC

## 2015-11-02 NOTE — Progress Notes (Signed)
11/02/2015, 6:47 AM  Mental Status:  Awake, Alert, Oriented  Anterior segment:Slit lamp examination at the bedside.     Cornea  Clear, No KP or defect.  Blood spot seen  (a good sign of eyes down positioning)    Anterior Chamber Clear, no cells or flare    Lens:   2+Cataract  Intra Ocular Pressure 22 mmHg with Tonopen  Vitreous: Clear 95%gas bubble   Retina:  Attached Good laser reaction   Impression: Excellent result Retina attached Poor view  Final Diagnosis: Active Problems:   Macular hole   Plan: start post operative eye drops.   Add alphagan.   Discharge to home.  Give post operative instructions.  Patient cautioned to look for symptoms of inflammation.  She agreed to call my office if there is ANY pain or redness in the eye.    [name] 11/02/2015, 6:47 AM

## 2015-11-02 NOTE — Discharge Summary (Signed)
Discharge summary not needed on [facility] patients per medical records. 

## 2015-11-02 NOTE — Progress Notes (Signed)
Discharge instructions reviewed with pt and eye bag with medications given.  Pt verbalized understanding and had no questions.  Pt discharged in stable condition via wheelchair with husband.  Eliezer [place]

## 2015-11-03 ENCOUNTER — Encounter (INDEPENDENT_AMBULATORY_CARE_PROVIDER_SITE_OTHER): Payer: BLUE CROSS/BLUE SHIELD | Admitting: Ophthalmology

## 2015-11-03 DIAGNOSIS — H35342 Macular cyst, hole, or pseudohole, left eye: Secondary | ICD-10-CM

## 2015-11-04 ENCOUNTER — Inpatient Hospital Stay (INDEPENDENT_AMBULATORY_CARE_PROVIDER_SITE_OTHER): Payer: BLUE CROSS/BLUE SHIELD | Admitting: Ophthalmology

## 2015-11-04 DIAGNOSIS — H35342 Macular cyst, hole, or pseudohole, left eye: Secondary | ICD-10-CM

## 2015-11-08 ENCOUNTER — Inpatient Hospital Stay (INDEPENDENT_AMBULATORY_CARE_PROVIDER_SITE_OTHER): Payer: BLUE CROSS/BLUE SHIELD | Admitting: Ophthalmology

## 2015-11-08 DIAGNOSIS — H35342 Macular cyst, hole, or pseudohole, left eye: Secondary | ICD-10-CM

## 2015-11-29 ENCOUNTER — Encounter (INDEPENDENT_AMBULATORY_CARE_PROVIDER_SITE_OTHER): Payer: BLUE CROSS/BLUE SHIELD | Admitting: Ophthalmology

## 2015-11-29 DIAGNOSIS — H35342 Macular cyst, hole, or pseudohole, left eye: Secondary | ICD-10-CM

## 2016-01-09 DIAGNOSIS — R7989 Other specified abnormal findings of blood chemistry: Secondary | ICD-10-CM | POA: Insufficient documentation

## 2016-02-07 ENCOUNTER — Encounter (INDEPENDENT_AMBULATORY_CARE_PROVIDER_SITE_OTHER): Payer: BLUE CROSS/BLUE SHIELD | Admitting: Ophthalmology

## 2016-02-15 ENCOUNTER — Encounter (INDEPENDENT_AMBULATORY_CARE_PROVIDER_SITE_OTHER): Payer: BLUE CROSS/BLUE SHIELD | Admitting: Ophthalmology

## 2016-02-15 DIAGNOSIS — I10 Essential (primary) hypertension: Secondary | ICD-10-CM

## 2016-02-15 DIAGNOSIS — H35342 Macular cyst, hole, or pseudohole, left eye: Secondary | ICD-10-CM | POA: Diagnosis not present

## 2016-02-15 DIAGNOSIS — H43811 Vitreous degeneration, right eye: Secondary | ICD-10-CM | POA: Diagnosis not present

## 2016-02-15 DIAGNOSIS — H2513 Age-related nuclear cataract, bilateral: Secondary | ICD-10-CM | POA: Diagnosis not present

## 2016-02-15 DIAGNOSIS — H35371 Puckering of macula, right eye: Secondary | ICD-10-CM

## 2016-02-15 DIAGNOSIS — H35033 Hypertensive retinopathy, bilateral: Secondary | ICD-10-CM

## 2016-05-17 ENCOUNTER — Encounter (INDEPENDENT_AMBULATORY_CARE_PROVIDER_SITE_OTHER): Payer: BLUE CROSS/BLUE SHIELD | Admitting: Ophthalmology

## 2016-09-27 ENCOUNTER — Encounter (INDEPENDENT_AMBULATORY_CARE_PROVIDER_SITE_OTHER): Payer: Self-pay | Admitting: Ophthalmology

## 2017-01-28 ENCOUNTER — Ambulatory Visit: Payer: BLUE CROSS/BLUE SHIELD

## 2017-02-06 ENCOUNTER — Ambulatory Visit: Payer: Self-pay | Attending: Oncology

## 2018-03-07 ENCOUNTER — Other Ambulatory Visit: Payer: Self-pay | Admitting: Internal Medicine

## 2018-03-07 DIAGNOSIS — R7401 Elevation of levels of liver transaminase levels: Secondary | ICD-10-CM

## 2018-03-07 DIAGNOSIS — R740 Nonspecific elevation of levels of transaminase and lactic acid dehydrogenase [LDH]: Principal | ICD-10-CM

## 2018-03-12 ENCOUNTER — Ambulatory Visit
Admission: RE | Admit: 2018-03-12 | Discharge: 2018-03-12 | Disposition: A | Discharge status: Home / Self Care | Payer: Self-pay | Source: Ambulatory Visit | Attending: Internal Medicine | Admitting: Internal Medicine

## 2018-05-10 DIAGNOSIS — Z23 Encounter for immunization: Secondary | ICD-10-CM | POA: Diagnosis not present

## 2018-06-04 DIAGNOSIS — Z1231 Encounter for screening mammogram for malignant neoplasm of breast: Secondary | ICD-10-CM | POA: Diagnosis not present

## 2019-03-23 DIAGNOSIS — M1A00X0 Idiopathic chronic gout, unspecified site, without tophus (tophi): Secondary | ICD-10-CM | POA: Insufficient documentation

## 2020-05-16 ENCOUNTER — Other Ambulatory Visit: Payer: Self-pay | Admitting: Internal Medicine

## 2020-05-16 DIAGNOSIS — Z1231 Encounter for screening mammogram for malignant neoplasm of breast: Secondary | ICD-10-CM

## 2020-05-23 ENCOUNTER — Other Ambulatory Visit: Payer: Self-pay

## 2020-05-23 ENCOUNTER — Ambulatory Visit
Admission: RE | Admit: 2020-05-23 | Discharge: 2020-05-23 | Disposition: A | Discharge status: Home / Self Care | Payer: 59 | Source: Ambulatory Visit | Attending: Internal Medicine | Admitting: Internal Medicine

## 2020-05-23 DIAGNOSIS — Z1231 Encounter for screening mammogram for malignant neoplasm of breast: Secondary | ICD-10-CM

## 2020-05-25 ENCOUNTER — Other Ambulatory Visit: Payer: Self-pay | Admitting: *Deleted

## 2020-05-25 ENCOUNTER — Inpatient Hospital Stay
Admission: RE | Admit: 2020-05-25 | Discharge: 2020-05-25 | Disposition: A | Discharge status: Home / Self Care | Payer: Self-pay | Source: Ambulatory Visit | Attending: *Deleted | Admitting: *Deleted

## 2020-05-25 DIAGNOSIS — Z1231 Encounter for screening mammogram for malignant neoplasm of breast: Secondary | ICD-10-CM

## 2021-03-17 DIAGNOSIS — Z131 Encounter for screening for diabetes mellitus: Secondary | ICD-10-CM | POA: Diagnosis not present

## 2021-03-17 DIAGNOSIS — M1A00X0 Idiopathic chronic gout, unspecified site, without tophus (tophi): Secondary | ICD-10-CM | POA: Diagnosis not present

## 2021-03-17 DIAGNOSIS — Z1389 Encounter for screening for other disorder: Secondary | ICD-10-CM | POA: Diagnosis not present

## 2021-03-17 DIAGNOSIS — Z13220 Encounter for screening for lipoid disorders: Secondary | ICD-10-CM | POA: Diagnosis not present

## 2021-03-17 DIAGNOSIS — Z0000 Encounter for general adult medical examination without abnormal findings: Secondary | ICD-10-CM | POA: Diagnosis not present

## 2021-03-29 DIAGNOSIS — Z0000 Encounter for general adult medical examination without abnormal findings: Secondary | ICD-10-CM | POA: Diagnosis not present

## 2021-07-14 ENCOUNTER — Other Ambulatory Visit: Payer: Self-pay | Admitting: Internal Medicine

## 2021-07-14 DIAGNOSIS — Z1231 Encounter for screening mammogram for malignant neoplasm of breast: Secondary | ICD-10-CM

## 2021-09-26 ENCOUNTER — Other Ambulatory Visit: Payer: Self-pay

## 2021-09-26 ENCOUNTER — Ambulatory Visit
Admission: RE | Admit: 2021-09-26 | Discharge: 2021-09-26 | Disposition: A | Discharge status: Home / Self Care | Payer: BC Managed Care – PPO | Source: Ambulatory Visit | Attending: Internal Medicine | Admitting: Internal Medicine

## 2021-09-26 DIAGNOSIS — Z1231 Encounter for screening mammogram for malignant neoplasm of breast: Secondary | ICD-10-CM | POA: Diagnosis not present

## 2022-01-16 DIAGNOSIS — D225 Melanocytic nevi of trunk: Secondary | ICD-10-CM | POA: Diagnosis not present

## 2022-01-16 DIAGNOSIS — D2239 Melanocytic nevi of other parts of face: Secondary | ICD-10-CM | POA: Diagnosis not present

## 2022-01-16 DIAGNOSIS — D485 Neoplasm of uncertain behavior of skin: Secondary | ICD-10-CM | POA: Diagnosis not present

## 2022-01-16 DIAGNOSIS — L820 Inflamed seborrheic keratosis: Secondary | ICD-10-CM | POA: Diagnosis not present

## 2022-02-06 DIAGNOSIS — C44612 Basal cell carcinoma of skin of right upper limb, including shoulder: Secondary | ICD-10-CM | POA: Diagnosis not present

## 2022-06-26 DIAGNOSIS — E785 Hyperlipidemia, unspecified: Secondary | ICD-10-CM | POA: Diagnosis not present

## 2022-06-26 DIAGNOSIS — I10 Essential (primary) hypertension: Secondary | ICD-10-CM | POA: Diagnosis not present

## 2022-06-26 DIAGNOSIS — R300 Dysuria: Secondary | ICD-10-CM | POA: Diagnosis not present

## 2022-06-26 DIAGNOSIS — N390 Urinary tract infection, site not specified: Secondary | ICD-10-CM | POA: Diagnosis not present

## 2022-07-12 ENCOUNTER — Other Ambulatory Visit: Payer: Self-pay | Admitting: Family Medicine

## 2022-07-12 DIAGNOSIS — R7303 Prediabetes: Secondary | ICD-10-CM | POA: Diagnosis not present

## 2022-07-12 DIAGNOSIS — E7800 Pure hypercholesterolemia, unspecified: Secondary | ICD-10-CM | POA: Diagnosis not present

## 2022-07-12 DIAGNOSIS — Z1231 Encounter for screening mammogram for malignant neoplasm of breast: Secondary | ICD-10-CM

## 2022-07-12 DIAGNOSIS — I10 Essential (primary) hypertension: Secondary | ICD-10-CM | POA: Diagnosis not present

## 2022-07-12 DIAGNOSIS — E782 Mixed hyperlipidemia: Secondary | ICD-10-CM | POA: Diagnosis not present

## 2022-07-12 DIAGNOSIS — Z0000 Encounter for general adult medical examination without abnormal findings: Secondary | ICD-10-CM | POA: Diagnosis not present

## 2022-07-12 DIAGNOSIS — Z1331 Encounter for screening for depression: Secondary | ICD-10-CM | POA: Diagnosis not present

## 2022-07-12 DIAGNOSIS — M109 Gout, unspecified: Secondary | ICD-10-CM | POA: Diagnosis not present

## 2022-07-12 DIAGNOSIS — R7989 Other specified abnormal findings of blood chemistry: Secondary | ICD-10-CM | POA: Diagnosis not present

## 2022-08-02 ENCOUNTER — Ambulatory Visit: Payer: BC Managed Care – PPO | Admitting: *Deleted

## 2022-08-06 DIAGNOSIS — R300 Dysuria: Secondary | ICD-10-CM | POA: Diagnosis not present

## 2022-08-06 DIAGNOSIS — N390 Urinary tract infection, site not specified: Secondary | ICD-10-CM | POA: Diagnosis not present

## 2022-08-28 ENCOUNTER — Ambulatory Visit: Payer: BC Managed Care – PPO | Admitting: *Deleted

## 2022-09-05 DIAGNOSIS — N390 Urinary tract infection, site not specified: Secondary | ICD-10-CM | POA: Diagnosis not present

## 2022-09-14 DIAGNOSIS — N390 Urinary tract infection, site not specified: Secondary | ICD-10-CM | POA: Diagnosis not present

## 2022-09-14 DIAGNOSIS — N8111 Cystocele, midline: Secondary | ICD-10-CM | POA: Diagnosis not present

## 2022-09-14 DIAGNOSIS — R102 Pelvic and perineal pain: Secondary | ICD-10-CM | POA: Diagnosis not present

## 2022-09-14 DIAGNOSIS — Z79899 Other long term (current) drug therapy: Secondary | ICD-10-CM | POA: Diagnosis not present

## 2022-09-14 DIAGNOSIS — N3641 Hypermobility of urethra: Secondary | ICD-10-CM | POA: Diagnosis not present

## 2022-09-25 DIAGNOSIS — N8111 Cystocele, midline: Secondary | ICD-10-CM | POA: Diagnosis not present

## 2022-09-25 DIAGNOSIS — R102 Pelvic and perineal pain: Secondary | ICD-10-CM | POA: Diagnosis not present

## 2022-09-25 DIAGNOSIS — N390 Urinary tract infection, site not specified: Secondary | ICD-10-CM | POA: Diagnosis not present

## 2022-09-25 DIAGNOSIS — N393 Stress incontinence (female) (male): Secondary | ICD-10-CM | POA: Diagnosis not present

## 2022-11-05 DIAGNOSIS — F330 Major depressive disorder, recurrent, mild: Secondary | ICD-10-CM | POA: Insufficient documentation

## 2022-11-05 DIAGNOSIS — Z0000 Encounter for general adult medical examination without abnormal findings: Secondary | ICD-10-CM | POA: Insufficient documentation

## 2022-11-30 ENCOUNTER — Other Ambulatory Visit: Payer: Self-pay | Admitting: Internal Medicine

## 2022-11-30 DIAGNOSIS — Z1231 Encounter for screening mammogram for malignant neoplasm of breast: Secondary | ICD-10-CM

## 2023-01-09 ENCOUNTER — Ambulatory Visit
Admission: RE | Admit: 2023-01-09 | Discharge: 2023-01-09 | Disposition: A | Discharge status: Home / Self Care | Payer: Medicare Other | Source: Ambulatory Visit | Attending: Internal Medicine | Admitting: Internal Medicine

## 2023-01-09 DIAGNOSIS — Z1231 Encounter for screening mammogram for malignant neoplasm of breast: Secondary | ICD-10-CM | POA: Insufficient documentation

## 2023-09-17 IMAGING — MG MM DIGITAL SCREENING BILAT W/ TOMO AND CAD
8 series · 9 of 24 positions shown · non-contrast
Comparison: Previous exam(s).

CLINICAL DATA: Screening.

EXAM:
DIGITAL SCREENING BILATERAL MAMMOGRAM WITH TOMOSYNTHESIS AND CAD
TECHNIQUE: Bilateral screening digital craniocaudal and mediolateral oblique
mammograms were obtained. Bilateral screening digital breast
tomosynthesis was performed. The images were evaluated with
computer-aided detection.

[L MLO synth-2D]
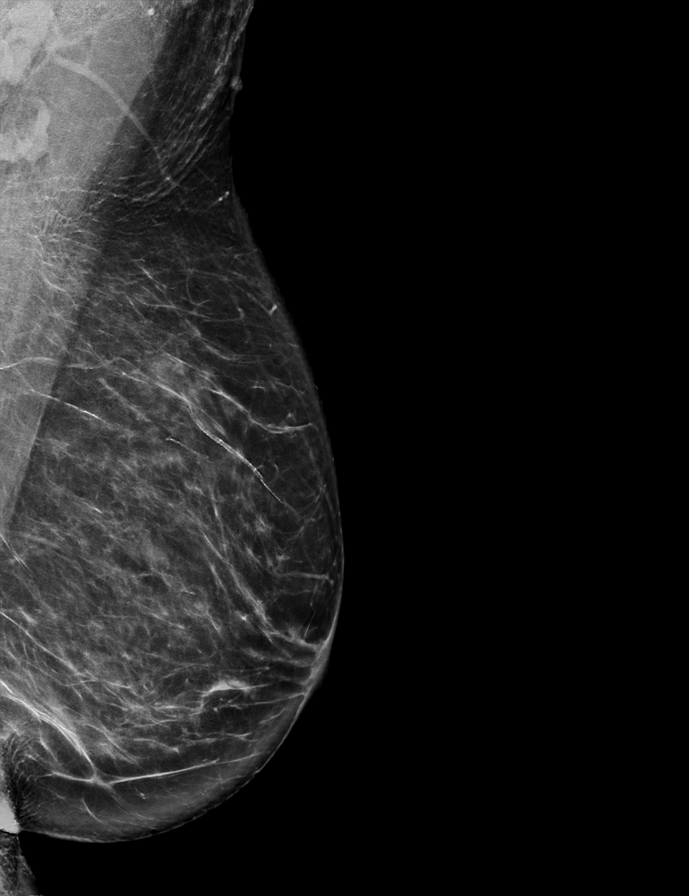

[R MLO synth-2D]
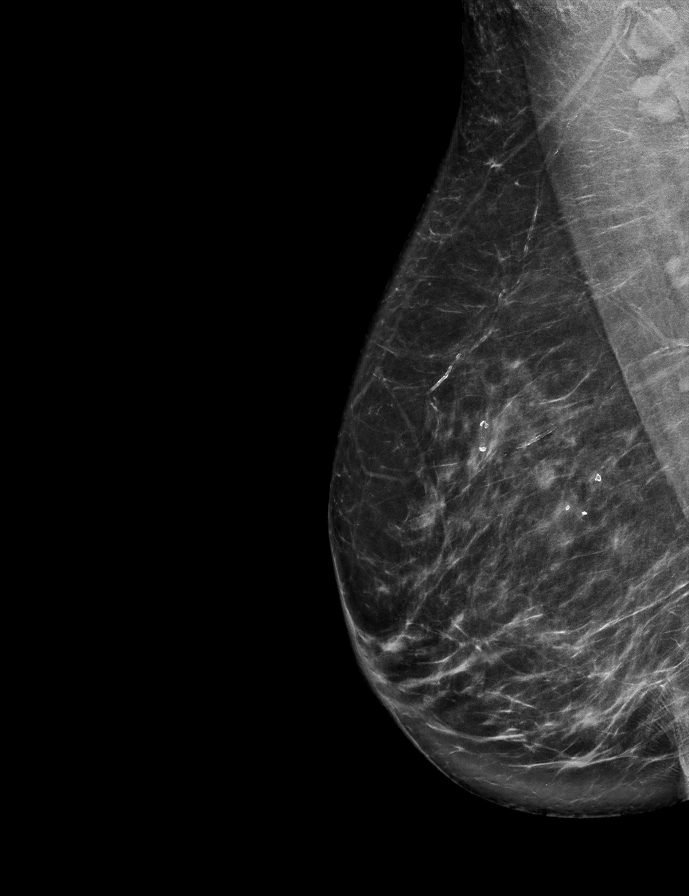

[L CC synth-2D]
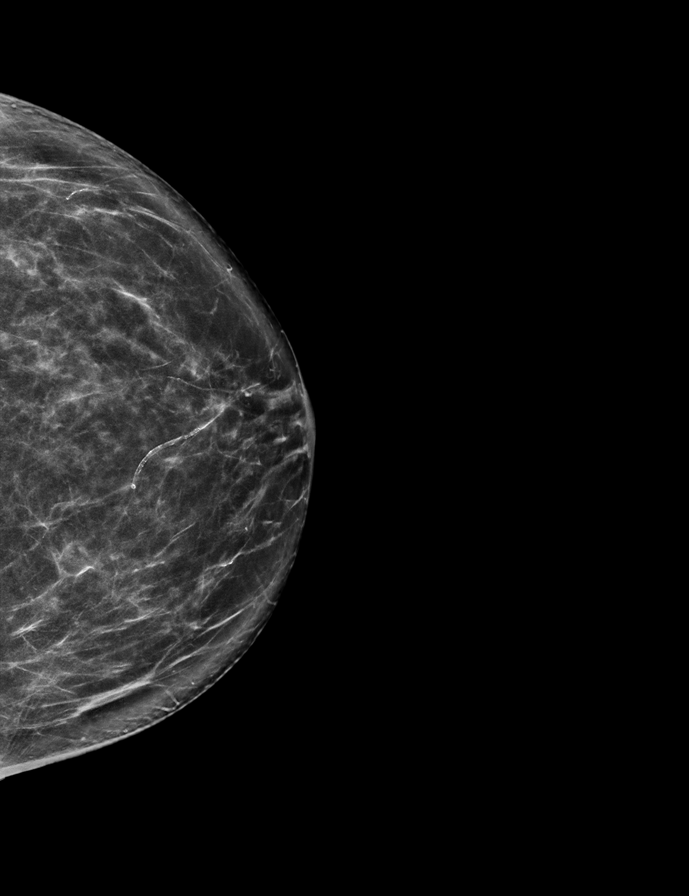

[R CC synth-2D]
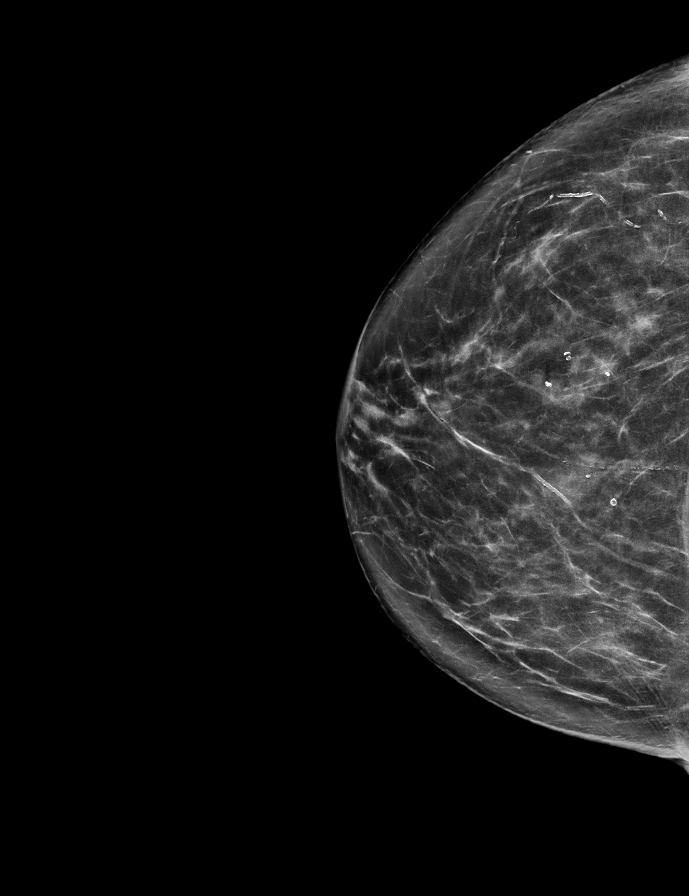

[L MLO tomo · 2 of 74 frames shown]
[frame 24/74]
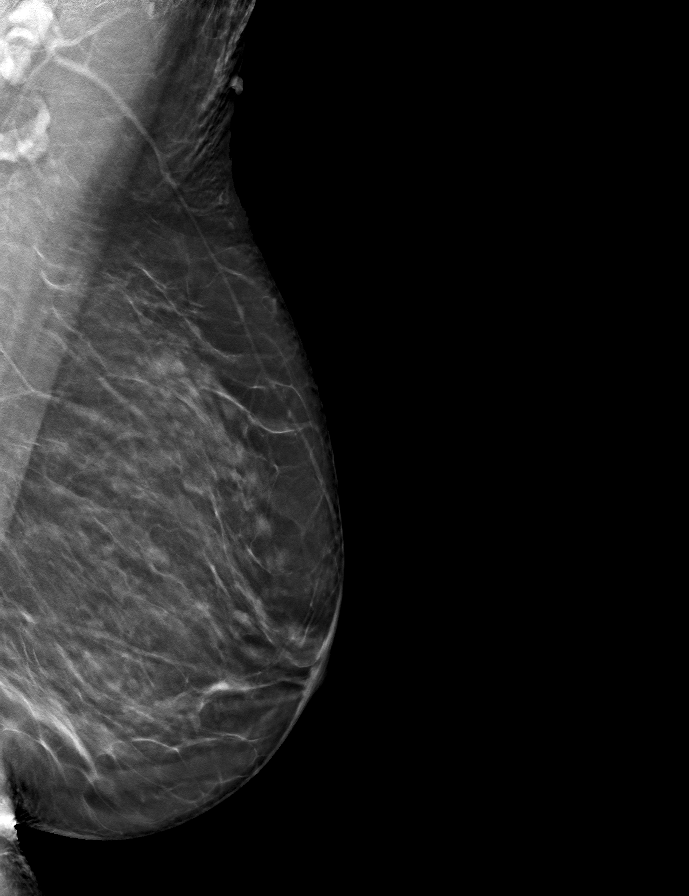
[frame 37/74]
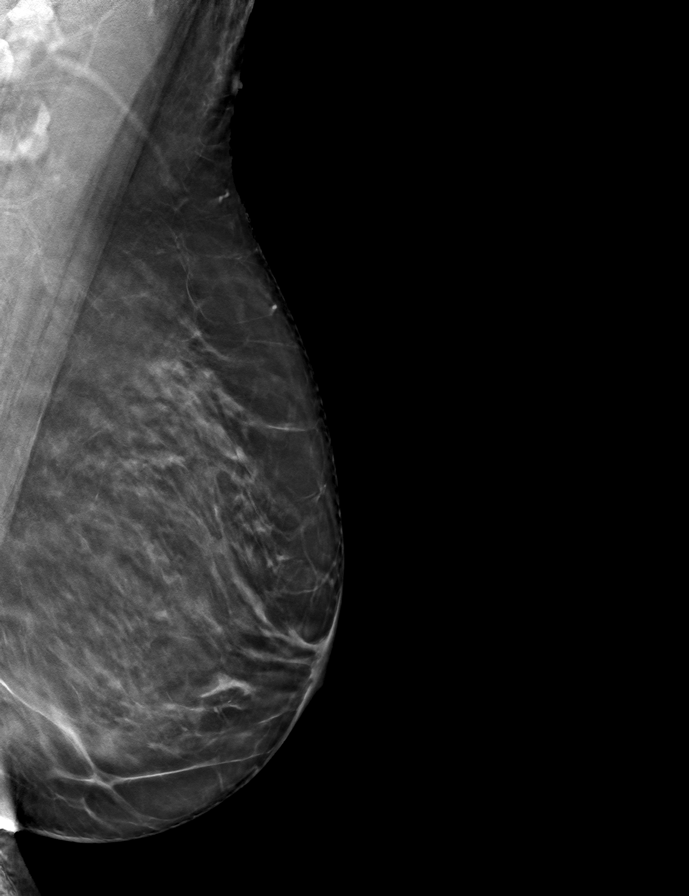

[R MLO tomo · tomo slice 39/76.0]
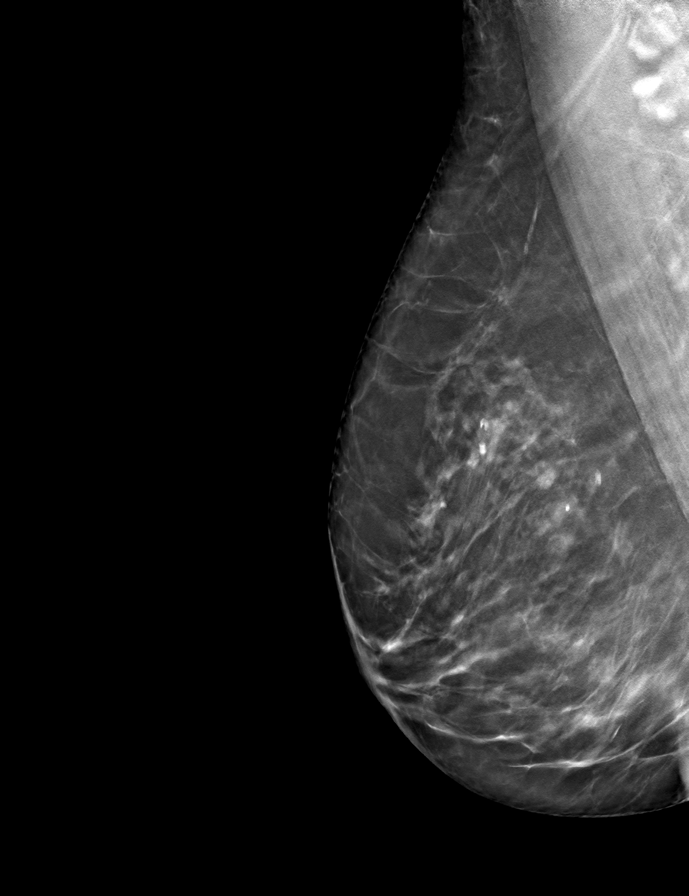

[R CC tomo · tomo slice 36/71.0]
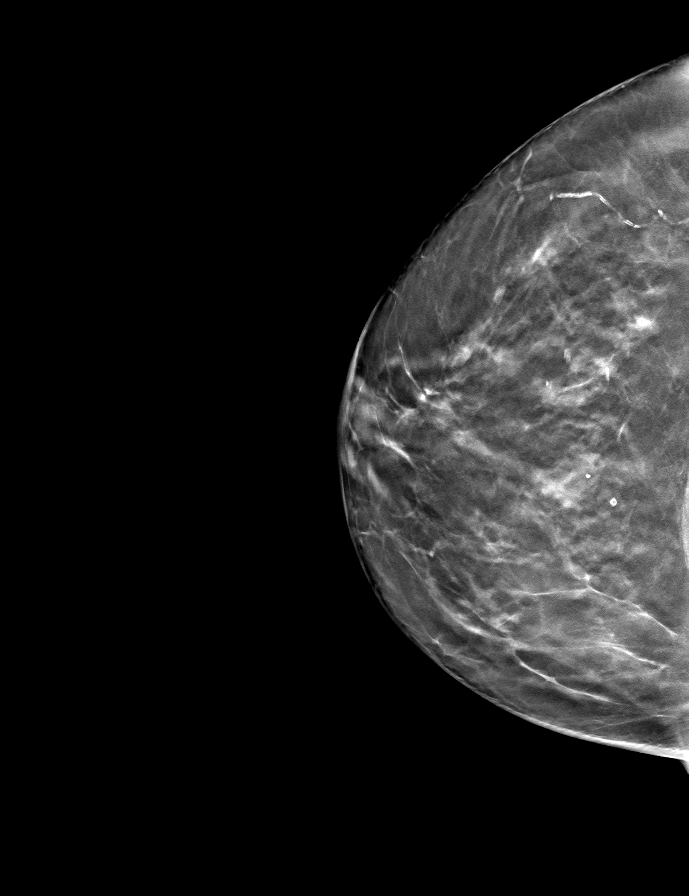

[L CC tomo · tomo slice 36/71.0]
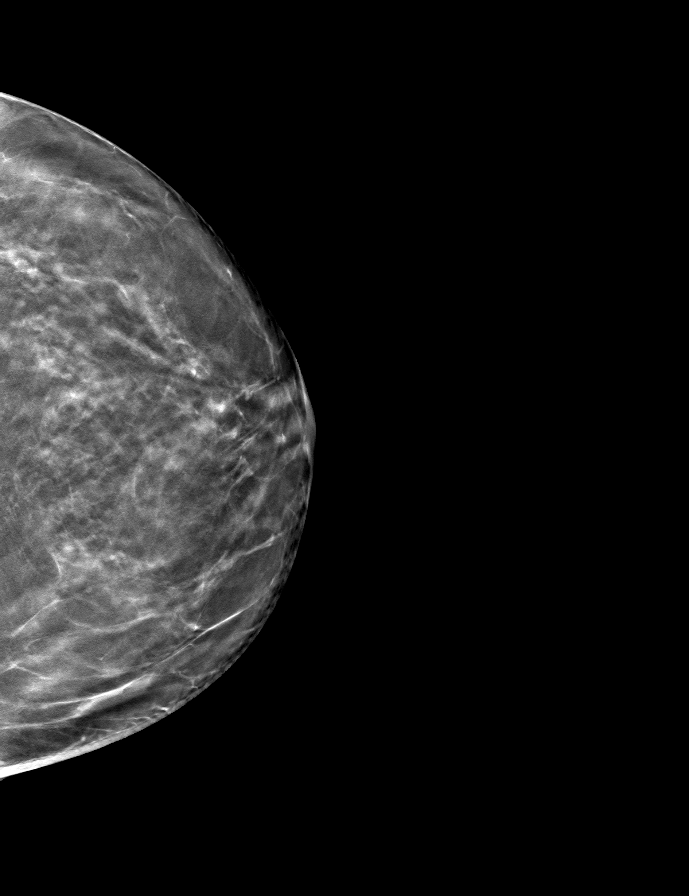

[9 of 24 positions shown; findings below may reference images not displayed]

ACR Breast Density Category b: There are scattered areas of
fibroglandular density.
FINDINGS: There are no findings suspicious for malignancy.
IMPRESSION: No mammographic evidence of malignancy. A result letter of this
screening mammogram will be mailed directly to the patient.

RECOMMENDATION:
Screening mammogram in one year. (Code:51-O-LD2)

BI-RADS CATEGORY  1: Negative.

## 2023-11-15 ENCOUNTER — Other Ambulatory Visit: Payer: Self-pay | Admitting: Internal Medicine

## 2023-11-15 DIAGNOSIS — R634 Abnormal weight loss: Secondary | ICD-10-CM

## 2023-11-15 DIAGNOSIS — R1084 Generalized abdominal pain: Secondary | ICD-10-CM

## 2023-11-15 DIAGNOSIS — Z0000 Encounter for general adult medical examination without abnormal findings: Secondary | ICD-10-CM

## 2023-11-15 DIAGNOSIS — D500 Iron deficiency anemia secondary to blood loss (chronic): Secondary | ICD-10-CM

## 2023-11-21 ENCOUNTER — Ambulatory Visit
Admission: RE | Admit: 2023-11-21 | Discharge: 2023-11-21 | Disposition: A | Discharge status: Home / Self Care | Payer: Medicare (Managed Care) | Source: Ambulatory Visit | Attending: Internal Medicine | Admitting: Internal Medicine

## 2023-11-21 DIAGNOSIS — D500 Iron deficiency anemia secondary to blood loss (chronic): Secondary | ICD-10-CM | POA: Insufficient documentation

## 2023-11-21 DIAGNOSIS — R1084 Generalized abdominal pain: Secondary | ICD-10-CM | POA: Insufficient documentation

## 2023-11-21 DIAGNOSIS — Z0000 Encounter for general adult medical examination without abnormal findings: Secondary | ICD-10-CM | POA: Diagnosis present

## 2023-11-21 DIAGNOSIS — R634 Abnormal weight loss: Secondary | ICD-10-CM | POA: Insufficient documentation

## 2023-11-21 MED ORDER — IOHEXOL 300 MG/ML  SOLN
100.0000 mL | Freq: Once | INTRAMUSCULAR | Status: AC | PRN
  Administered 2023-11-21: 100 mL via INTRAVENOUS

## 2023-11-27 ENCOUNTER — Encounter: Payer: Self-pay | Admitting: Internal Medicine

## 2023-11-27 ENCOUNTER — Other Ambulatory Visit: Payer: Self-pay | Admitting: Internal Medicine

## 2023-11-27 ENCOUNTER — Ambulatory Visit
Admission: RE | Admit: 2023-11-27 | Discharge: 2023-11-27 | Disposition: A | Discharge status: Home / Self Care | Payer: Medicare (Managed Care) | Source: Ambulatory Visit | Attending: Internal Medicine | Admitting: Internal Medicine

## 2023-11-27 DIAGNOSIS — R1900 Intra-abdominal and pelvic swelling, mass and lump, unspecified site: Secondary | ICD-10-CM | POA: Insufficient documentation

## 2023-11-27 MED ORDER — GADOBUTROL 1 MMOL/ML IV SOLN
6.0000 mL | Freq: Once | INTRAVENOUS | Status: AC | PRN
  Administered 2023-11-27: 6 mL via INTRAVENOUS

## 2023-12-05 ENCOUNTER — Encounter: Payer: Self-pay | Admitting: Oncology

## 2023-12-05 ENCOUNTER — Inpatient Hospital Stay: Payer: Medicare (Managed Care)

## 2023-12-05 ENCOUNTER — Inpatient Hospital Stay: Payer: Medicare (Managed Care) | Attending: Oncology | Admitting: Oncology

## 2023-12-05 VITALS — BP 116/70 | HR 68 | Temp 97.6°F | Resp 19 | Wt 124.6 lb

## 2023-12-05 DIAGNOSIS — D649 Anemia, unspecified: Secondary | ICD-10-CM

## 2023-12-05 DIAGNOSIS — N83201 Unspecified ovarian cyst, right side: Secondary | ICD-10-CM

## 2023-12-05 DIAGNOSIS — R1032 Left lower quadrant pain: Secondary | ICD-10-CM | POA: Insufficient documentation

## 2023-12-05 DIAGNOSIS — R161 Splenomegaly, not elsewhere classified: Secondary | ICD-10-CM | POA: Insufficient documentation

## 2023-12-05 DIAGNOSIS — N83291 Other ovarian cyst, right side: Secondary | ICD-10-CM | POA: Insufficient documentation

## 2023-12-05 DIAGNOSIS — R748 Abnormal levels of other serum enzymes: Secondary | ICD-10-CM | POA: Diagnosis not present

## 2023-12-05 DIAGNOSIS — Z809 Family history of malignant neoplasm, unspecified: Secondary | ICD-10-CM | POA: Insufficient documentation

## 2023-12-05 DIAGNOSIS — Z803 Family history of malignant neoplasm of breast: Secondary | ICD-10-CM | POA: Diagnosis not present

## 2023-12-05 DIAGNOSIS — Z79899 Other long term (current) drug therapy: Secondary | ICD-10-CM | POA: Insufficient documentation

## 2023-12-05 LAB — RETIC PANEL
Immature Retic Fract: 10.7 % (ref 2.3–15.9)
RBC.: 4.06 MIL/uL (ref 3.87–5.11)
Retic Count, Absolute: 61.3 10*3/uL (ref 19.0–186.0)
Retic Ct Pct: 1.5 % (ref 0.4–3.1)
Reticulocyte Hemoglobin: 31.3 pg (ref >27.9)

## 2023-12-05 LAB — HEPATITIS PANEL, ACUTE
HCV Ab: NONREACTIVE
Hep A IgM: NONREACTIVE
Hep B C IgM: NONREACTIVE
Hepatitis B Surface Ag: NONREACTIVE

## 2023-12-05 LAB — CBC WITH DIFFERENTIAL/PLATELET
Abs Immature Granulocytes: 0.04 10*3/uL (ref 0.00–0.07)
Basophils Absolute: 0.1 10*3/uL (ref 0.0–0.1)
Basophils Relative: 1 %
Eosinophils Absolute: 0.1 10*3/uL (ref 0.0–0.5)
Eosinophils Relative: 2 %
HCT: 34.2 % — ABNORMAL LOW (ref 36.0–46.0)
Hemoglobin: 11.6 g/dL — ABNORMAL LOW (ref 12.0–15.0)
Immature Granulocytes: 1 %
Lymphocytes Relative: 20 %
Lymphs Abs: 1.5 10*3/uL (ref 0.7–4.0)
MCH: 28.3 pg (ref 26.0–34.0)
MCHC: 33.9 g/dL (ref 30.0–36.0)
MCV: 83.4 fL (ref 80.0–100.0)
Monocytes Absolute: 0.3 10*3/uL (ref 0.1–1.0)
Monocytes Relative: 4 %
Neutro Abs: 5.5 10*3/uL (ref 1.7–7.7)
Neutrophils Relative %: 72 %
Platelets: 167 10*3/uL (ref 150–400)
RBC: 4.1 MIL/uL (ref 3.87–5.11)
RDW: 13.5 % (ref 11.5–15.5)
WBC: 7.5 10*3/uL (ref 4.0–10.5)
nRBC: 0 % (ref 0.0–0.2)

## 2023-12-05 LAB — FOLATE: Folate: >40 ng/mL (ref >5.9)

## 2023-12-05 LAB — IRON AND TIBC
Iron: 74 ug/dL (ref 28–170)
Saturation Ratios: 21 % (ref 10.4–31.8)
TIBC: 347 ug/dL (ref 250–450)
UIBC: 273 ug/dL

## 2023-12-05 LAB — GAMMA GT: GGT: 16 U/L (ref 7–50)

## 2023-12-05 LAB — TECHNOLOGIST SMEAR REVIEW

## 2023-12-05 LAB — HIV ANTIBODY (ROUTINE TESTING W REFLEX): HIV Screen 4th Generation wRfx: NONREACTIVE

## 2023-12-05 LAB — LACTATE DEHYDROGENASE: LDH: 118 U/L (ref 98–192)

## 2023-12-05 LAB — C-REACTIVE PROTEIN: CRP: 1.5 mg/dL — ABNORMAL HIGH (ref <1.0)

## 2023-12-05 LAB — FERRITIN: Ferritin: 56 ng/mL (ref 11–307)

## 2023-12-05 LAB — SEDIMENTATION RATE: Sed Rate: 30 mm/h (ref 0–30)

## 2023-12-05 NOTE — Assessment & Plan Note (Signed)
 Check iron, TIBC ferritin folate level. Lab Results  Component Value Date   HGB 11.6 (L) 12/05/2023   TIBC 347 12/05/2023   IRONPCTSAT 21 12/05/2023   FERRITIN 56 12/05/2023

## 2023-12-05 NOTE — Assessment & Plan Note (Signed)
 Imaging results reviewed and discussed with patient.  She has mild splenomegaly. Discussed with patient that differential diagnosis for splenomegaly is very broad, including infection, autoimmune disease, infiltrative disease [amyloidosis, sarcoidosis, storage disease], liver disease, myeloproliferative or lymphoproliferative disease. Check CBC, LDH, HIV, hepatitis, JAK2 mutation with reflex, BCR-ABL 1 FISH, protein electrophoresis, ANA, CRP, ESR, EBV, peripheral blood flow cytometry, smear

## 2023-12-05 NOTE — Assessment & Plan Note (Signed)
Check GGT 

## 2023-12-05 NOTE — Progress Notes (Signed)
 Hematology/Oncology Consult note [phone] [phone]        REFERRING PROVIDER: Danella Penton, MD   CHIEF COMPLAINTS/REASON FOR VISIT:  Evaluation of splenomegaly   ASSESSMENT & PLAN:   Splenomegaly Imaging results reviewed and discussed with patient.  She has mild splenomegaly. Discussed with patient that differential diagnosis for splenomegaly is very broad, including infection, autoimmune disease, infiltrative disease [amyloidosis, sarcoidosis, storage disease], liver disease, myeloproliferative or lymphoproliferative disease. Check CBC, LDH, HIV, hepatitis, JAK2 mutation with reflex, BCR-ABL 1 FISH, protein electrophoresis, ANA, CRP, ESR, EBV, peripheral blood flow cytometry, smear   Elevated alkaline phosphatase level Check GGT  Normocytic anemia Check iron, TIBC ferritin folate level. Lab Results  Component Value Date   HGB 11.6 (L) 12/05/2023   TIBC 347 12/05/2023   IRONPCTSAT 21 12/05/2023   FERRITIN 56 12/05/2023      Orders Placed This Encounter  Procedures   Ferritin    Standing Status:   Future    Number of Occurrences:   1    Expected Date:   12/05/2023    Expiration Date:   06/05/2024   Iron and TIBC    Standing Status:   Future    Number of Occurrences:   1    Expected Date:   12/05/2023    Expiration Date:   12/04/2024   Folate    Standing Status:   Future    Number of Occurrences:   1    Expected Date:   12/05/2023    Expiration Date:   12/04/2024   CBC with Differential/Platelet    Standing Status:   Future    Number of Occurrences:   1    Expected Date:   12/05/2023    Expiration Date:   12/04/2024   Retic Panel    Standing Status:   Future    Number of Occurrences:   1    Expected Date:   12/05/2023    Expiration Date:   12/04/2024   Lactate dehydrogenase    Standing Status:   Future    Number of Occurrences:   1    Expected Date:   12/05/2023    Expiration Date:   12/04/2024   HIV Antibody (routine testing w rflx)     Standing Status:   Future    Number of Occurrences:   1    Expected Date:   12/05/2023    Expiration Date:   12/04/2024   Hepatitis panel, acute    Standing Status:   Future    Number of Occurrences:   1    Expected Date:   12/05/2023    Expiration Date:   12/04/2024   JAK2 V617F rfx CALR/MPL/E12-15    Standing Status:   Future    Number of Occurrences:   1    Expected Date:   12/05/2023    Expiration Date:   12/04/2024   BCR-ABL1 FISH    Standing Status:   Future    Number of Occurrences:   1    Expected Date:   12/05/2023    Expiration Date:   12/04/2024   Protein electrophoresis, serum    Standing Status:   Future    Number of Occurrences:   1    Expected Date:   12/05/2023    Expiration Date:   12/04/2024   ANA w/Reflex    Standing Status:   Future    Number of Occurrences:   1    Expected Date:   12/05/2023  Expiration Date:   12/04/2024   C-reactive protein    Standing Status:   Future    Number of Occurrences:   1    Expected Date:   12/05/2023    Expiration Date:   12/04/2024   Sedimentation rate    Standing Status:   Future    Number of Occurrences:   1    Expected Date:   12/05/2023    Expiration Date:   12/04/2024   Epstein barr vrs(ebv dna by pcr)    Standing Status:   Future    Number of Occurrences:   1    Expected Date:   12/05/2023    Expiration Date:   12/04/2024   Flow cytometry panel-leukemia/lymphoma work-up    Standing Status:   Future    Number of Occurrences:   1    Expected Date:   12/05/2023    Expiration Date:   12/04/2024   Technologist smear review    Standing Status:   Future    Number of Occurrences:   1    Expiration Date:   12/04/2024    Clinical information::   anemia, splenomegaly   Gamma GT    Standing Status:   Future    Number of Occurrences:   1    Expected Date:   12/05/2023    Expiration Date:   12/04/2024   Follow-up in a few weeks to discuss results. All questions were answered. The patient knows to call the clinic with any  problems, questions or concerns.  Rickard Patience, MD, PhD [facility] Hematology Oncology 12/05/2023   HISTORY OF PRESENTING ILLNESS:   Julia Noble is a  73 y.o.  female with PMH listed below was seen in consultation at the request of  Danella Penton, MD  for evaluation of splenomegaly  A slightly enlarged spleen was incidentally found on a CT scan performed on November 21, 2023, initially ordered due to occasional tenderness on the left side of her abdomen. This tenderness has been present for a while but was often overlooked. Low hemoglobin levels were noted, prompting further investigation.  She has a history of mild anemia, with hemoglobin levels noted to be 11.5 in June 2024 and slightly lower in recent tests. She experiences occasional night sweats, less frequently than before, and attributes some hot flashes to menopause. There is no unintentional weight loss, but she reports a decrease in appetite following intentional weight loss of 20 pounds.  The CT scan also revealed an ovarian cyst on the right side, assessed as low risk following  MRI pelvis  She has an upcoming appointment with a gynecologist to discuss this finding further.  She experiences joint pain primarily in her hands, attributed to arthritis and gout. There is swelling in her thumb but no significant pain. She engages in activities like crocheting, which may contribute to joint discomfort.    MEDICAL HISTORY:  Past Medical History:  Diagnosis Date   Anemia    when she was younger   Basal cell carcinoma of neck    Cervical cancer (HCC)    GERD (gastroesophageal reflux disease)    Heart murmur    has had one, no problem with it in later years   History of gout    "touch" of gout   Hyperlipidemia    Hypertension    IBS (irritable bowel syndrome)    more on the constipation side   Macular degeneration of both eyes    Sinus headache  with seasonal allergies   Situational depression    "in the past" (11/01/2015)     SURGICAL HISTORY: Past Surgical History:  Procedure Laterality Date   25 GAUGE PARS PLANA VITRECTOMY WITH 20 GAUGE MVR PORT FOR MACULAR HOLE Left 11/01/2015   Procedure: LEFT EYE 25 GAUGE PARS PLANA VITRECTOMY WITH 20 GAUGE MVR PORT FOR MACULAR HOLE; INSERTION OF C3F8; HEADSCOPE LASER; SERUM PATCH ;  Surgeon: Sherrie George, MD;  Location: [facility] OR;  Service: Ophthalmology;  Laterality: Left;   BASAL CELL CARCINOMA EXCISION     neck   COLONOSCOPY     x 2   DILATION AND CURETTAGE OF UTERUS  X 1   EYE SURGERY     MEMBRANE PEEL Left 11/01/2015   Procedure: MEMBRANE PEEL LEFT EYE;  Surgeon: Sherrie George, MD;  Location: [facility] OR;  Service: Ophthalmology;  Laterality: Left;   PARS PLANA VITRECTOMY Left 11/01/2015   laser, membrane peel, gas fluid exchange, serum patch. C3F8 injection   VAGINAL HYSTERECTOMY      SOCIAL HISTORY: Social History   Socioeconomic History   Marital status: Married    Spouse name: Not on file   Number of children: Not on file   Years of education: Not on file   Highest education level: Not on file  Occupational History   Not on file  Tobacco Use   Smoking status: Never   Smokeless tobacco: Never  Substance and Sexual Activity   Alcohol use: No   Drug use: No   Sexual activity: Not Currently    Birth control/protection: None  Other Topics Concern   Not on file  Social History Narrative   ** Merged History Encounter **       Social Drivers of Health   Financial Resource Strain: Low Risk  (11/14/2023)   Received from [facility]   Overall Financial Resource Strain (CARDIA)    Difficulty of Paying Living Expenses: Not hard at all  Food Insecurity: No Food Insecurity (12/05/2023)   Hunger Vital Sign    Worried About Running Out of Food in the Last Year: Never true    Ran Out of Food in the Last Year: Never true  Transportation Needs: No Transportation Needs (12/05/2023)   PRAPARE - Administrator, Civil Service (Medical):  No    Lack of Transportation (Non-Medical): No  Physical Activity: Not on file  Stress: Not on file  Social Connections: Not on file  Intimate Partner Violence: Not At Risk (12/05/2023)   Humiliation, Afraid, Rape, and Kick questionnaire    Fear of Current or Ex-Partner: No    Emotionally Abused: No    Physically Abused: No    Sexually Abused: No    FAMILY HISTORY: Family History  Problem Relation Age of Onset   Breast cancer Mother 3   Hypertension Mother    Cancer Father        ? cancerous polyp   COPD Father    Cancer Paternal Grandmother     ALLERGIES:  has no known allergies.  MEDICATIONS:  Current Outpatient Medications  Medication Sig Dispense Refill   acetaminophen (TYLENOL) 500 MG tablet Take 500-1,000 mg by mouth daily as needed for mild pain.     allopurinol (ZYLOPRIM) 300 MG tablet Take by mouth.     Blood Glucose Monitoring Suppl (ACCU-CHEK GUIDE ME) w/Device KIT 1 each as directed     ibuprofen (ADVIL,MOTRIN) 200 MG tablet Take 200 mg by mouth daily as needed  for moderate pain.     lisinopril (PRINIVIL,ZESTRIL) 20 MG tablet Take 20 mg by mouth daily.     Multiple Vitamin (MULTIVITAMIN) tablet Take 1 tablet by mouth daily.     naproxen sodium (ANAPROX) 220 MG tablet Take 220 mg by mouth daily as needed (for pain).     pantoprazole (PROTONIX) 40 MG tablet Take by mouth.     sertraline (ZOLOFT) 50 MG tablet Take 50 mg by mouth every morning.     simvastatin (ZOCOR) 20 MG tablet Take 20 mg by mouth at bedtime.     traZODone (DESYREL) 50 MG tablet Take 1 tablet by mouth at bedtime.     triamterene-hydrochlorothiazide (MAXZIDE-25) 37.5-25 MG tablet Take 1 tablet by mouth daily.     amLODipine (NORVASC) 5 MG tablet Take 5 mg by mouth daily. (Patient not taking: Reported on 12/05/2023)     bacitracin-polymyxin b (POLYSPORIN) ophthalmic ointment Place 1 application into the left eye 3 (three) times daily. apply to eye every 12 hours while awake (Patient not taking:  Reported on 12/05/2023) 3.5 g 0   brimonidine (ALPHAGAN) 0.2 % ophthalmic solution Place 1 drop into the left eye 2 (two) times daily. (Patient not taking: Reported on 12/05/2023) 5 mL 12   gatifloxacin (ZYMAXID) 0.5 % SOLN Place 1 drop into the left eye 4 (four) times daily. (Patient not taking: Reported on 12/05/2023)     omeprazole (PRILOSEC) 20 MG capsule Take 20 mg by mouth daily. (Patient not taking: Reported on 12/05/2023)     prednisoLONE acetate (PRED FORTE) 1 % ophthalmic suspension Place 1 drop into the left eye 4 (four) times daily. (Patient not taking: Reported on 12/05/2023) 5 mL 0   No current facility-administered medications for this visit.    Review of Systems  Constitutional:  Negative for appetite change, chills, fatigue and fever.  HENT:   Negative for hearing loss and voice change.   Eyes:  Negative for eye problems.  Respiratory:  Negative for chest tightness and cough.   Cardiovascular:  Negative for chest pain.  Gastrointestinal:  Negative for abdominal distention, abdominal pain and blood in stool.       Intermittent left lower quadrant pain  Endocrine: Negative for hot flashes.  Genitourinary:  Negative for difficulty urinating and frequency.   Musculoskeletal:  Negative for arthralgias.  Skin:  Negative for itching and rash.  Neurological:  Negative for extremity weakness.  Hematological:  Negative for adenopathy.  Psychiatric/Behavioral:  Negative for confusion.    PHYSICAL EXAMINATION: ECOG PERFORMANCE STATUS: 0 - Asymptomatic Vitals:   12/05/23 1507  BP: 116/70  Pulse: 68  Resp: 19  Temp: 97.6 F (36.4 C)  SpO2: 98%   Filed Weights   12/05/23 1507  Weight: 124 lb 9.6 oz (56.5 kg)    Physical Exam Constitutional:      General: She is not in acute distress. HENT:     Head: Normocephalic and atraumatic.  Eyes:     General: No scleral icterus. Cardiovascular:     Rate and Rhythm: Normal rate and regular rhythm.  Pulmonary:     Effort: Pulmonary  effort is normal. No respiratory distress.     Breath sounds: No wheezing.  Abdominal:     General: Bowel sounds are normal. There is no distension.     Palpations: Abdomen is soft.     Comments: Palpable liver edge  Musculoskeletal:        General: No deformity. Normal range of motion.  Cervical back: Normal range of motion and neck supple.  Skin:    General: Skin is warm and dry.     Findings: No erythema or rash.  Neurological:     Mental Status: She is alert and oriented to person, place, and time. Mental status is at baseline.  Psychiatric:        Mood and Affect: Mood normal.     LABORATORY DATA:  I have reviewed the data as listed    Latest Ref Rng & Units 12/05/2023    3:52 PM  CBC  WBC 4.0 - 10.5 K/uL 7.5   Hemoglobin 12.0 - 15.0 g/dL 16.1   Hematocrit 09.6 - 46.0 % 34.2   Platelets 150 - 400 K/uL 167       Latest Ref Rng & Units 11/01/2015   10:02 AM  CMP  Glucose 65 - 99 mg/dL 045   BUN 6 - 20 mg/dL 18   Creatinine 4.09 - 1.00 mg/dL 8.11   Sodium 914 - 782 mmol/L 140   Potassium 3.5 - 5.1 mmol/L 3.3   Chloride 101 - 111 mmol/L 102   CO2 22 - 32 mmol/L 27   Calcium 8.9 - 10.3 mg/dL 9.7       RADIOGRAPHIC STUDIES: I have personally reviewed the radiological images as listed and agreed with the findings in the report. MR PELVIS W WO CONTRAST Result Date: 11/27/2023 CLINICAL DATA:  Right adnexal lesion on recent CT. EXAM: MRI PELVIS WITHOUT AND WITH CONTRAST TECHNIQUE: Multiplanar multisequence MR imaging of the pelvis was performed both before and after administration of intravenous contrast. CONTRAST:  6mL GADAVIST GADOBUTROL 1 MMOL/ML IV SOLN COMPARISON:  CT on 11/21/2023 FINDINGS: Lower Urinary Tract: No urinary bladder or urethral abnormality identified. Bowel: Mild sigmoid diverticulosis, without signs of diverticulitis. Vascular/Lymphatic: Unremarkable. No pathologically enlarged pelvic lymph nodes identified. Reproductive: -- Uterus: Prior hysterectomy.  Vaginal cuff is unremarkable in appearance. -- Right ovary: A small complex cystic lesion is seen with several thin internal septations, measuring 3.5 x 2.6 x 1.9 cm. No thickened or nodular septations, enhancing soft tissue component, or internal fat identified. A portion of this lesion has a T2 dark peripheral rim, suggesting hemosiderin. This has characteristics of an O-RADS MRI 3 lesion. -- Left ovary: Appears normal. No ovarian or adnexal masses identified. Other: No peritoneal thickening or abnormal free fluid. Musculoskeletal:  Unremarkable. IMPRESSION: 3.5 cm complex cystic lesion in the right ovary, with characteristics consistent with O-RADS MRI category 3 lesion. (Low risk; PPV for malignancy ~5%). Consider continued imaging follow-up by MRI in 6 months. Prior hysterectomy. Mild sigmoid diverticulosis, without radiographic evidence of diverticulitis. Electronically Signed   By: Danae Orleans M.D.   On: 11/27/2023 19:54   CT ABDOMEN PELVIS W CONTRAST Result Date: 11/26/2023 CLINICAL DATA:  Abdominal pain EXAM: CT ABDOMEN AND PELVIS WITH CONTRAST TECHNIQUE: Multidetector CT imaging of the abdomen and pelvis was performed using the standard protocol following bolus administration of intravenous contrast. RADIATION DOSE REDUCTION: This exam was performed according to the departmental dose-optimization program which includes automated exposure control, adjustment of the mA and/or kV according to patient size and/or use of iterative reconstruction technique. CONTRAST:  OMNIPAQUE IOHEXOL 300 MG/ML  SOLN COMPARISON:  None Available. FINDINGS: Lower chest: No acute abnormality. Hepatobiliary: No focal liver abnormality is seen. No gallstones, gallbladder wall thickening, or biliary dilatation. Pancreas: Unremarkable. No pancreatic ductal dilatation or surrounding inflammatory changes. Spleen: Enlarged measuring 12.9 by 13.3 x 6 cm in the longitudinal, AP  and transverse diameterVolume of 1029 g  Adrenals/Urinary Tract: Adrenal glands are unremarkable. Kidneys are normal, without renal calculi, focal lesion, or hydronephrosis. Bladder is unremarkable. 1.8 cm cyst of the lower pole right kidney. Bosniak 1, No follow-up imaging is recommended. JACR 2018 Feb; 264-273, Management of the Incidental Renal Mass on CT, [employer] 2021; 814-848, Bosniak Classification of Cystic Renal Masses, Version 2019. Stomach/Bowel: Stomach is within normal limits. Appendix appears normal. No evidence of bowel wall thickening, distention, or inflammatory changes. Cecal appendix normal moderate amount of residual fecal material left side of the colon without diverticulitis Vascular/Lymphatic: Reproductive: Prior hysterectomy Within the right side of the pelvis soft tissue nodular density measuring 2.3 x 1.9 cm could correlate with the right ovary. It has some calcifications along the lateral margin and 1 nodular calcification along the medial margin., calcifications are usually benign of no clinical significance and could be related to prior hemorrhagic cyst, could be related to endometriosis are other inflammatory changes. Other: No abdominal wall hernia or abnormality. No abdominopelvic ascites. Grade 1 posterior listhesis of L5 on L4. Musculoskeletal: IMPRESSION: *Splenomegaly. *Within the right side of the pelvis soft tissue nodular density measuring 2.3 x 1.9 cm could correlate with the right ovary. It has some calcifications along the lateral margin and 1 nodular calcification along the medial margin. Calcifications are usually benign of no clinical significance and could be related to prior hemorrhagic cyst, could be related to endometriosis are other inflammatory changes. *Grade 1 posterior listhesis of L5 on L4. Electronically Signed   By: Shaaron Adler M.D.   On: 11/26/2023 08:26

## 2023-12-06 LAB — EPSTEIN BARR VRS(EBV DNA BY PCR): EBV DNA QN by PCR: NEGATIVE [IU]/mL

## 2023-12-06 LAB — ANA W/REFLEX: Anti Nuclear Antibody (ANA): NEGATIVE

## 2023-12-09 LAB — COMP PANEL: LEUKEMIA/LYMPHOMA

## 2023-12-09 LAB — PROTEIN ELECTROPHORESIS, SERUM
A/G Ratio: 1.6 (ref 0.7–1.7)
Albumin ELP: 4 g/dL (ref 2.9–4.4)
Alpha-1-Globulin: 0.2 g/dL (ref 0.0–0.4)
Alpha-2-Globulin: 0.8 g/dL (ref 0.4–1.0)
Beta Globulin: 0.9 g/dL (ref 0.7–1.3)
Gamma Globulin: 0.5 g/dL (ref 0.4–1.8)
Globulin, Total: 2.5 g/dL (ref 2.2–3.9)
Total Protein ELP: 6.5 g/dL (ref 6.0–8.5)

## 2023-12-10 LAB — BCR-ABL1 FISH
Cells Analyzed: 200
Cells Counted: 200

## 2023-12-12 LAB — JAK2 V617F RFX CALR/MPL/E12-15

## 2023-12-12 LAB — CALR +MPL + E12-E15  (REFLEX)

## 2023-12-23 ENCOUNTER — Inpatient Hospital Stay: Payer: Medicare (Managed Care) | Admitting: Oncology

## 2023-12-23 ENCOUNTER — Encounter: Payer: Self-pay | Admitting: Oncology

## 2023-12-23 VITALS — BP 114/72 | HR 90 | Temp 97.7°F | Resp 18 | Wt 123.6 lb

## 2023-12-23 DIAGNOSIS — R161 Splenomegaly, not elsewhere classified: Secondary | ICD-10-CM

## 2023-12-23 DIAGNOSIS — R748 Abnormal levels of other serum enzymes: Secondary | ICD-10-CM

## 2023-12-23 DIAGNOSIS — D649 Anemia, unspecified: Secondary | ICD-10-CM

## 2023-12-23 NOTE — Progress Notes (Signed)
 Hematology/Oncology Progress note [phone] [phone]           REFERRING PROVIDER: Sari Cunning, MD   CHIEF COMPLAINTS/REASON FOR VISIT:  Anemia splenomegaly   ASSESSMENT & PLAN:   Splenomegaly Imaging results reviewed and discussed with patient.  She has mild splenomegaly. Discussed with patient that differential diagnosis for splenomegaly is very broad, including infection, autoimmune disease, infiltrative disease [amyloidosis, sarcoidosis, storage disease], liver disease, myeloproliferative or lymphoproliferative disease. Labs reviewed and discussed with patient.  Normal LDH, negative HIV, negative hepatitis, negative JAK2 mutation with reflex, negative BCR-ABL 1 FISH, no M protein on protein electrophoresis, negative ANA, normal ESR, negative EBV, negative peripheral blood flow cytometry.  CRP is elevated indicating underlying chronic inflammation. For now patient is asymptomatic.  I recommend observation.  Elevated alkaline phosphatase level Normal GGT. Observation  Normocytic anemia Lab Results  Component Value Date   HGB 11.6 (L) 12/05/2023   TIBC 347 12/05/2023   IRONPCTSAT 21 12/05/2023   FERRITIN 56 12/05/2023    Adequate iron store. Hemoglobin is mildly decreased, overall stable.  Recommend observation.  Orders Placed This Encounter  Procedures   CBC with Differential (Cancer Center Only)    Standing Status:   Future    Expected Date:   06/23/2024    Expiration Date:   12/22/2024   Retic Panel    Standing Status:   Future    Expected Date:   06/23/2024    Expiration Date:   12/22/2024   Folate    Standing Status:   Future    Expected Date:   06/23/2024    Expiration Date:   12/22/2024   Vitamin B12    Standing Status:   Future    Expected Date:   06/23/2024    Expiration Date:   12/22/2024   Follow-up in 6 months.  All questions were answered. The patient knows to call the clinic with any problems, questions or  concerns.  Timmy Forbes, MD, PhD [facility] Hematology Oncology 12/23/2023   HISTORY OF PRESENTING ILLNESS:   Julia Noble is a  73 y.o.  female with PMH listed below was seen in consultation at the request of  Sari Cunning, MD  for evaluation of splenomegaly  A slightly enlarged spleen was incidentally found on a CT scan performed on November 21, 2023, initially ordered due to occasional tenderness on the left side of her abdomen. This tenderness has been present for a while but was often overlooked. Low hemoglobin levels were noted, prompting further investigation.  She has a history of mild anemia, with hemoglobin levels noted to be 11.5 in June 2024 and slightly lower in recent tests. She experiences occasional night sweats, less frequently than before, and attributes some hot flashes to menopause. There is no unintentional weight loss, but she reports a decrease in appetite following intentional weight loss of 20 pounds.  The CT scan also revealed an ovarian cyst on the right side, assessed as low risk following  MRI pelvis  She has an upcoming appointment with a gynecologist to discuss this finding further.  She experiences joint pain primarily in her hands, attributed to arthritis and gout. There is swelling in her thumb but no significant pain. She engages in activities like crocheting, which may contribute to joint discomfort.  Today patient presents to discuss results.  MEDICAL HISTORY:  Past Medical History:  Diagnosis Date   Anemia    when she was younger   Basal cell carcinoma of neck  Cervical cancer (HCC)    GERD (gastroesophageal reflux disease)    Heart murmur    has had one, no problem with it in later years   History of gout    "touch" of gout   Hyperlipidemia    Hypertension    IBS (irritable bowel syndrome)    more on the constipation side   Macular degeneration of both eyes    Sinus headache    with seasonal allergies   Situational depression    "in the  past" (11/01/2015)    SURGICAL HISTORY: Past Surgical History:  Procedure Laterality Date   25 GAUGE PARS PLANA VITRECTOMY WITH 20 GAUGE MVR PORT FOR MACULAR HOLE Left 11/01/2015   Procedure: LEFT EYE 25 GAUGE PARS PLANA VITRECTOMY WITH 20 GAUGE MVR PORT FOR MACULAR HOLE; INSERTION OF C3F8; HEADSCOPE LASER; SERUM PATCH ;  Surgeon: Rexene Catching, MD;  Location: [facility] OR;  Service: Ophthalmology;  Laterality: Left;   BASAL CELL CARCINOMA EXCISION     neck   COLONOSCOPY     x 2   DILATION AND CURETTAGE OF UTERUS  X 1   EYE SURGERY     MEMBRANE PEEL Left 11/01/2015   Procedure: MEMBRANE PEEL LEFT EYE;  Surgeon: Rexene Catching, MD;  Location: [facility] OR;  Service: Ophthalmology;  Laterality: Left;   PARS PLANA VITRECTOMY Left 11/01/2015   laser, membrane peel, gas fluid exchange, serum patch. C3F8 injection   VAGINAL HYSTERECTOMY      SOCIAL HISTORY: Social History   Socioeconomic History   Marital status: Married    Spouse name: Not on file   Number of children: Not on file   Years of education: Not on file   Highest education level: Not on file  Occupational History   Not on file  Tobacco Use   Smoking status: Never   Smokeless tobacco: Never  Substance and Sexual Activity   Alcohol use: No   Drug use: No   Sexual activity: Not Currently    Birth control/protection: None  Other Topics Concern   Not on file  Social History Narrative   ** Merged History Encounter **       Social Drivers of Health   Financial Resource Strain: Low Risk  (12/11/2023)   Received from [facility]   Overall Financial Resource Strain (CARDIA)    Difficulty of Paying Living Expenses: Not hard at all  Food Insecurity: No Food Insecurity (12/11/2023)   Received from [facility]   Hunger Vital Sign    Worried About Running Out of Food in the Last Year: Never true    Ran Out of Food in the Last Year: Never true  Transportation Needs: No Transportation Needs (12/11/2023)    Received from [facility] - Transportation    In the past 12 months, has lack of transportation kept you from medical appointments or from getting medications?: No    Lack of Transportation (Non-Medical): No  Physical Activity: Not on file  Stress: Not on file  Social Connections: Not on file  Intimate Partner Violence: Not At Risk (12/05/2023)   Humiliation, Afraid, Rape, and Kick questionnaire    Fear of Current or Ex-Partner: No    Emotionally Abused: No    Physically Abused: No    Sexually Abused: No    FAMILY HISTORY: Family History  Problem Relation Age of Onset   Breast cancer Mother 57   Hypertension Mother    Cancer  Father        ? cancerous polyp   COPD Father    Cancer Paternal Grandmother     ALLERGIES:  has no known allergies.  MEDICATIONS:  Current Outpatient Medications  Medication Sig Dispense Refill   acetaminophen  (TYLENOL ) 500 MG tablet Take 500-1,000 mg by mouth daily as needed for mild pain.     allopurinol (ZYLOPRIM) 300 MG tablet Take by mouth.     Blood Glucose Monitoring Suppl (ACCU-CHEK GUIDE ME) w/Device KIT 1 each as directed     ibuprofen  (ADVIL ,MOTRIN ) 200 MG tablet Take 200 mg by mouth daily as needed for moderate pain.     lisinopril  (PRINIVIL ,ZESTRIL ) 20 MG tablet Take 20 mg by mouth daily.     Multiple Vitamin (MULTIVITAMIN) tablet Take 1 tablet by mouth daily.     naproxen  sodium (ANAPROX ) 220 MG tablet Take 220 mg by mouth daily as needed (for pain).     pantoprazole  (PROTONIX ) 40 MG tablet Take by mouth.     sertraline  (ZOLOFT ) 50 MG tablet Take 50 mg by mouth every morning.     simvastatin  (ZOCOR ) 20 MG tablet Take 20 mg by mouth at bedtime.     traZODone (DESYREL) 50 MG tablet Take 1 tablet by mouth at bedtime.     triamterene -hydrochlorothiazide  (MAXZIDE -25) 37.5-25 MG tablet Take 1 tablet by mouth daily.     bacitracin -polymyxin b  (POLYSPORIN ) ophthalmic ointment Place 1 application into the left eye 3  (three) times daily. apply to eye every 12 hours while awake (Patient not taking: Reported on 12/23/2023) 3.5 g 0   brimonidine  (ALPHAGAN ) 0.2 % ophthalmic solution Place 1 drop into the left eye 2 (two) times daily. (Patient not taking: Reported on 12/23/2023) 5 mL 12   gatifloxacin  (ZYMAXID ) 0.5 % SOLN Place 1 drop into the left eye 4 (four) times daily. (Patient not taking: Reported on 12/23/2023)     prednisoLONE  acetate (PRED FORTE ) 1 % ophthalmic suspension Place 1 drop into the left eye 4 (four) times daily. (Patient not taking: Reported on 12/23/2023) 5 mL 0   No current facility-administered medications for this visit.    Review of Systems  Constitutional:  Negative for appetite change, chills, fatigue and fever.  HENT:   Negative for hearing loss and voice change.   Eyes:  Negative for eye problems.  Respiratory:  Negative for chest tightness and cough.   Cardiovascular:  Negative for chest pain.  Gastrointestinal:  Negative for abdominal distention, abdominal pain and blood in stool.       Intermittent left lower quadrant pain  Endocrine: Negative for hot flashes.  Genitourinary:  Negative for difficulty urinating and frequency.   Musculoskeletal:  Negative for arthralgias.  Skin:  Negative for itching and rash.  Neurological:  Negative for extremity weakness.  Hematological:  Negative for adenopathy.  Psychiatric/Behavioral:  Negative for confusion.    PHYSICAL EXAMINATION: ECOG PERFORMANCE STATUS: 0 - Asymptomatic Vitals:   12/23/23 1338  BP: 114/72  Pulse: 90  Resp: 18  Temp: 97.7 F (36.5 C)  SpO2: 99%   Filed Weights   12/23/23 1338  Weight: 123 lb 9.6 oz (56.1 kg)    Physical Exam Constitutional:      General: She is not in acute distress. HENT:     Head: Normocephalic and atraumatic.  Eyes:     General: No scleral icterus. Cardiovascular:     Rate and Rhythm: Normal rate and regular rhythm.  Pulmonary:     Effort: Pulmonary effort is  normal. No  respiratory distress.     Breath sounds: No wheezing.  Abdominal:     General: Bowel sounds are normal. There is no distension.     Palpations: Abdomen is soft.     Comments: Palpable liver edge  Musculoskeletal:        General: No deformity. Normal range of motion.     Cervical back: Normal range of motion and neck supple.  Skin:    General: Skin is warm and dry.     Findings: No erythema or rash.  Neurological:     Mental Status: She is alert and oriented to person, place, and time. Mental status is at baseline.  Psychiatric:        Mood and Affect: Mood normal.     LABORATORY DATA:  I have reviewed the data as listed    Latest Ref Rng & Units 12/05/2023    3:52 PM  CBC  WBC 4.0 - 10.5 K/uL 7.5   Hemoglobin 12.0 - 15.0 g/dL 64.3   Hematocrit 32.9 - 46.0 % 34.2   Platelets 150 - 400 K/uL 167       Latest Ref Rng & Units 11/01/2015   10:02 AM  CMP  Glucose 65 - 99 mg/dL 518   BUN 6 - 20 mg/dL 18   Creatinine 8.41 - 1.00 mg/dL 6.60   Sodium 630 - 160 mmol/L 140   Potassium 3.5 - 5.1 mmol/L 3.3   Chloride 101 - 111 mmol/L 102   CO2 22 - 32 mmol/L 27   Calcium 8.9 - 10.3 mg/dL 9.7       RADIOGRAPHIC STUDIES: I have personally reviewed the radiological images as listed and agreed with the findings in the report. MR PELVIS W WO CONTRAST Result Date: 11/27/2023 CLINICAL DATA:  Right adnexal lesion on recent CT. EXAM: MRI PELVIS WITHOUT AND WITH CONTRAST TECHNIQUE: Multiplanar multisequence MR imaging of the pelvis was performed both before and after administration of intravenous contrast. CONTRAST:  6mL GADAVIST  GADOBUTROL  1 MMOL/ML IV SOLN COMPARISON:  CT on 11/21/2023 FINDINGS: Lower Urinary Tract: No urinary bladder or urethral abnormality identified. Bowel: Mild sigmoid diverticulosis, without signs of diverticulitis. Vascular/Lymphatic: Unremarkable. No pathologically enlarged pelvic lymph nodes identified. Reproductive: -- Uterus: Prior hysterectomy. Vaginal cuff is  unremarkable in appearance. -- Right ovary: A small complex cystic lesion is seen with several thin internal septations, measuring 3.5 x 2.6 x 1.9 cm. No thickened or nodular septations, enhancing soft tissue component, or internal fat identified. A portion of this lesion has a T2 dark peripheral rim, suggesting hemosiderin. This has characteristics of an O-RADS MRI 3 lesion. -- Left ovary: Appears normal. No ovarian or adnexal masses identified. Other: No peritoneal thickening or abnormal free fluid. Musculoskeletal:  Unremarkable. IMPRESSION: 3.5 cm complex cystic lesion in the right ovary, with characteristics consistent with O-RADS MRI category 3 lesion. (Low risk; PPV for malignancy ~5%). Consider continued imaging follow-up by MRI in 6 months. Prior hysterectomy. Mild sigmoid diverticulosis, without radiographic evidence of diverticulitis. Electronically Signed   By: Marlyce Sine M.D.   On: 11/27/2023 19:54   CT ABDOMEN PELVIS W CONTRAST Result Date: 11/26/2023 CLINICAL DATA:  Abdominal pain EXAM: CT ABDOMEN AND PELVIS WITH CONTRAST TECHNIQUE: Multidetector CT imaging of the abdomen and pelvis was performed using the standard protocol following bolus administration of intravenous contrast. RADIATION DOSE REDUCTION: This exam was performed according to the departmental dose-optimization program which includes automated exposure control, adjustment of the mA and/or kV according to patient  size and/or use of iterative reconstruction technique. CONTRAST:  OMNIPAQUE  IOHEXOL  300 MG/ML  SOLN COMPARISON:  None Available. FINDINGS: Lower chest: No acute abnormality. Hepatobiliary: No focal liver abnormality is seen. No gallstones, gallbladder wall thickening, or biliary dilatation. Pancreas: Unremarkable. No pancreatic ductal dilatation or surrounding inflammatory changes. Spleen: Enlarged measuring 12.9 by 13.3 x 6 cm in the longitudinal, AP and transverse diameterVolume of 1029 g Adrenals/Urinary Tract:  Adrenal glands are unremarkable. Kidneys are normal, without renal calculi, focal lesion, or hydronephrosis. Bladder is unremarkable. 1.8 cm cyst of the lower pole right kidney. Bosniak 1, No follow-up imaging is recommended. JACR 2018 Feb; 264-273, Management of the Incidental Renal Mass on CT, [employer] 2021; 814-848, Bosniak Classification of Cystic Renal Masses, Version 2019. Stomach/Bowel: Stomach is within normal limits. Appendix appears normal. No evidence of bowel wall thickening, distention, or inflammatory changes. Cecal appendix normal moderate amount of residual fecal material left side of the colon without diverticulitis Vascular/Lymphatic: Reproductive: Prior hysterectomy Within the right side of the pelvis soft tissue nodular density measuring 2.3 x 1.9 cm could correlate with the right ovary. It has some calcifications along the lateral margin and 1 nodular calcification along the medial margin., calcifications are usually benign of no clinical significance and could be related to prior hemorrhagic cyst, could be related to endometriosis are other inflammatory changes. Other: No abdominal wall hernia or abnormality. No abdominopelvic ascites. Grade 1 posterior listhesis of L5 on L4. Musculoskeletal: IMPRESSION: *Splenomegaly. *Within the right side of the pelvis soft tissue nodular density measuring 2.3 x 1.9 cm could correlate with the right ovary. It has some calcifications along the lateral margin and 1 nodular calcification along the medial margin. Calcifications are usually benign of no clinical significance and could be related to prior hemorrhagic cyst, could be related to endometriosis are other inflammatory changes. *Grade 1 posterior listhesis of L5 on L4. Electronically Signed   By: Fredrich Jefferson M.D.   On: 11/26/2023 08:26

## 2023-12-23 NOTE — Assessment & Plan Note (Signed)
 Imaging results reviewed and discussed with patient.  She has mild splenomegaly. Discussed with patient that differential diagnosis for splenomegaly is very broad, including infection, autoimmune disease, infiltrative disease [amyloidosis, sarcoidosis, storage disease], liver disease, myeloproliferative or lymphoproliferative disease. Labs reviewed and discussed with patient.  Normal LDH, negative HIV, negative hepatitis, negative JAK2 mutation with reflex, negative BCR-ABL 1 FISH, no M protein on protein electrophoresis, negative ANA, normal ESR, negative EBV, negative peripheral blood flow cytometry.  CRP is elevated indicating underlying chronic inflammation. For now patient is asymptomatic.  I recommend observation.

## 2023-12-23 NOTE — Assessment & Plan Note (Signed)
 Lab Results  Component Value Date   HGB 11.6 (L) 12/05/2023   TIBC 347 12/05/2023   IRONPCTSAT 21 12/05/2023   FERRITIN 56 12/05/2023    Adequate iron store. Hemoglobin is mildly decreased, overall stable.  Recommend observation.

## 2023-12-23 NOTE — Assessment & Plan Note (Signed)
 Normal GGT. Observation

## 2023-12-25 ENCOUNTER — Encounter: Payer: Self-pay | Admitting: Internal Medicine
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
## 2024-04-08 ENCOUNTER — Inpatient Hospital Stay: Payer: Medicare (Managed Care)

## 2024-04-08 ENCOUNTER — Ambulatory Visit: Payer: Self-pay | Admitting: Obstetrics and Gynecology

## 2024-04-08 ENCOUNTER — Inpatient Hospital Stay: Payer: Medicare (Managed Care) | Attending: Obstetrics and Gynecology | Admitting: Obstetrics and Gynecology

## 2024-04-08 VITALS — BP 115/67 | HR 71 | Temp 98.6°F | Resp 20 | Wt 128.3 lb

## 2024-04-08 DIAGNOSIS — I10 Essential (primary) hypertension: Secondary | ICD-10-CM | POA: Diagnosis not present

## 2024-04-08 DIAGNOSIS — K219 Gastro-esophageal reflux disease without esophagitis: Secondary | ICD-10-CM | POA: Insufficient documentation

## 2024-04-08 DIAGNOSIS — D649 Anemia, unspecified: Secondary | ICD-10-CM | POA: Insufficient documentation

## 2024-04-08 DIAGNOSIS — R102 Pelvic and perineal pain: Secondary | ICD-10-CM | POA: Diagnosis not present

## 2024-04-08 DIAGNOSIS — E785 Hyperlipidemia, unspecified: Secondary | ICD-10-CM | POA: Diagnosis not present

## 2024-04-08 DIAGNOSIS — Z8541 Personal history of malignant neoplasm of cervix uteri: Secondary | ICD-10-CM | POA: Diagnosis not present

## 2024-04-08 DIAGNOSIS — N83201 Unspecified ovarian cyst, right side: Secondary | ICD-10-CM | POA: Diagnosis not present

## 2024-04-08 DIAGNOSIS — Z85828 Personal history of other malignant neoplasm of skin: Secondary | ICD-10-CM | POA: Insufficient documentation

## 2024-04-08 DIAGNOSIS — R161 Splenomegaly, not elsewhere classified: Secondary | ICD-10-CM | POA: Diagnosis not present

## 2024-04-08 DIAGNOSIS — R011 Cardiac murmur, unspecified: Secondary | ICD-10-CM | POA: Insufficient documentation

## 2024-04-08 DIAGNOSIS — K589 Irritable bowel syndrome without diarrhea: Secondary | ICD-10-CM | POA: Diagnosis not present

## 2024-04-08 DIAGNOSIS — Z90710 Acquired absence of both cervix and uterus: Secondary | ICD-10-CM | POA: Insufficient documentation

## 2024-04-08 DIAGNOSIS — Z79899 Other long term (current) drug therapy: Secondary | ICD-10-CM | POA: Insufficient documentation

## 2024-04-08 DIAGNOSIS — N83209 Unspecified ovarian cyst, unspecified side: Secondary | ICD-10-CM | POA: Insufficient documentation

## 2024-04-08 DIAGNOSIS — Z803 Family history of malignant neoplasm of breast: Secondary | ICD-10-CM | POA: Diagnosis not present

## 2024-04-08 DIAGNOSIS — Z809 Family history of malignant neoplasm, unspecified: Secondary | ICD-10-CM | POA: Insufficient documentation

## 2024-04-08 NOTE — Addendum Note (Signed)
 Addended by: MANCIL BARTER on: 04/08/2024 04:19 PM   Modules accepted: Orders

## 2024-04-08 NOTE — Patient Instructions (Signed)
 Your surgery will be scheduled (date pending) You will have a pre-admission telephone visit prior to surgery. This visit will take around 40-45 minutes. Gyn oncology will see you 4-6 weeks following surgery for a post operative visit.

## 2024-04-08 NOTE — H&P (Signed)
 Gynecologic Oncology H&P  Referring Provider: Dr. Lovetta  Chief Concern: right ovarian cyst  Subjective:  Julia Noble is a 73 y.o. female who is seen in consultation from Dr. Lovetta for small right adnexal mass.  Pelvic pain and scans done that showed small cystic mass in right adnexa.  11/21/23 CT scan FINDINGS: Lower chest: No acute abnormality.   Hepatobiliary: No focal liver abnormality is seen. No gallstones, gallbladder wall thickening, or biliary dilatation.   Pancreas: Unremarkable. No pancreatic ductal dilatation or surrounding inflammatory changes.   Spleen: Enlarged measuring 12.9 by 13.3 x 6 cm in the longitudinal, AP and transverse diameterVolume of 1029 g   Adrenals/Urinary Tract: Adrenal glands are unremarkable. Kidneys are normal, without renal calculi, focal lesion, or hydronephrosis. Bladder is unremarkable. 1.8 cm cyst of the lower pole right kidney.   Bosniak 1, No follow-up imaging is recommended.   JACR 2018 Feb; 264-273, Management of the Incidental Renal Mass on CT, [employer] 2021; 814-848, Bosniak Classification of Cystic Renal Masses, Version 2019.   Stomach/Bowel: Stomach is within normal limits. Appendix appears normal. No evidence of bowel wall thickening, distention, or inflammatory changes. Cecal appendix normal moderate amount of residual fecal material left side of the colon without diverticulitis   Vascular/Lymphatic:   Reproductive: Prior hysterectomy   Within the right side of the pelvis soft tissue nodular density measuring 2.3 x 1.9 cm could correlate with the right ovary. It has some calcifications along the lateral margin and 1 nodular calcification along the medial margin., calcifications are usually benign of no clinical significance and could be related to prior hemorrhagic cyst, could be related to endometriosis are other inflammatory changes.   Other: No abdominal wall hernia or abnormality. No  abdominopelvic ascites. Grade 1 posterior listhesis of L5 on L4.   Musculoskeletal:   IMPRESSION: *Splenomegaly. *Within the right side of the pelvis soft tissue nodular density measuring 2.3 x 1.9 cm could correlate with the right ovary. It has some calcifications along the lateral margin and 1 nodular calcification along the medial margin. Calcifications are usually benign of no clinical significance and could be related to prior hemorrhagic cyst, could be related to endometriosis are other inflammatory changes. *Grade 1 posterior listhesis  11/27/23 MRI FINDINGS: Lower Urinary Tract: No urinary bladder or urethral abnormality identified.   Bowel: Mild sigmoid diverticulosis, without signs of diverticulitis.   Vascular/Lymphatic: Unremarkable. No pathologically enlarged pelvic lymph nodes identified.   Reproductive:   -- Uterus: Prior hysterectomy. Vaginal cuff is unremarkable in appearance.   -- Right ovary: A small complex cystic lesion is seen with several thin internal septations, measuring 3.5 x 2.6 x 1.9 cm. No thickened or nodular septations, enhancing soft tissue component, or internal fat identified. A portion of this lesion has a T2 dark peripheral rim, suggesting hemosiderin. This has characteristics of an O-RADS MRI 3 lesion.   -- Left ovary: Appears normal. No ovarian or adnexal masses identified.   Other: No peritoneal thickening or abnormal free fluid.   Musculoskeletal:  Unremarkable.   IMPRESSION: 3.5 cm complex cystic lesion in the right ovary, with characteristics consistent with O-RADS MRI category 3 lesion. (Low risk; PPV for malignancy ~5%). Consider continued imaging follow-up by MRI in 6 months.   Prior vaginal hysterectomy for cervical cancer.   Mild sigmoid diverticulosis, without radiographic evidence of diverticulitis.  She saw Dr Lovetta 12/11/23 The patient is a 73 year old female with a history of cervical cancer who  presents with a h/o left pelvic pain.seen by  Dr Cleotilde who performed scans.  She has been experiencing left pelvic pain for several months, described as tender. A CT scan and MRI were performed, revealing a slightly enlarged spleen. She is uncertain about the evaluation plan for the spleen.  She underwent a partial hysterectomy in 1983 due to cervical cancer( reported not spread outside the cx ), with the uterus removed but the ovaries retained. She has had seven pregnancies, resulting in five children, with two miscarriages, all delivered vaginally.  She reports intentional weight loss of approximately 20 pounds, along with abdominal bloating for about a year and early satiety for six to eight months. No bleeding or problems with bowel movements. She believes her last colonoscopy was about ten years ago.  In the family history, her paternal grandmother had cervical cancer and other cancers, and her mother had a small cancerous mass on her left breast, which was successfully removed.  12/11/23 Normal CA125 12.8, HE4 93.7 ROMA calculated 26%  12/23/23 Seem by Dr Babara for mild splenomegaly.   Discussed with patient that differential diagnosis for splenomegaly is very broad, including infection, autoimmune disease, infiltrative disease [amyloidosis, sarcoidosis, storage disease], liver disease, myeloproliferative or lymphoproliferative disease. Labs reviewed and discussed with patient.  Normal LDH, negative HIV, negative hepatitis, negative JAK2 mutation with reflex, negative BCR-ABL 1 FISH, no M protein on protein electrophoresis, negative ANA, normal ESR, negative EBV, negative peripheral blood flow cytometry. CRP is elevated indicating underlying chronic inflammation. For now patient is asymptomatic.  I recommend observation.  12/26/23 TVUS showed 4 cm multilocular cyst in right ovary Endovaginal Imaging ================ Uterus ====== Surgically Absent  Right Ovary ========= Visualized, Enlarged.  Size 42 mm x 29 mm x 19 mm Cyst(s)    Size 41.0 mm x 20.0 mm x 35 mm. Mean 32.0 mm. Vol 15.027 cm. Multilocular with anechoic cystic components. Posterior acoustic       shadowing is noted. No vascularity on color Doppler.  Right Tube ========= Not visualized  Left Ovary ======== Visualized, Normal. Outline: smooth. Morphology: appropriate. Size 16 mm x 8 mm x 8 mm No cysts identified  Left Tube ======== Not visualized  Cul de Sac ========= Normal  Impression ========= The uterus is surgically absent. Vaginal/ Cervical cuff appears grossly normal. Right ovarian lesion is noted. Please see details above. Left ovary appears within normal limits. There are no unusual adnexal findings.   03/30/24 TVUS Endovaginal Imaging  Uterus ====== Not examined Surgically Absent  Right Ovary ========= Visualized, Enlarged. Outline: irregular. Size 36 mm x 25 mm x 15 mm Cyst(s)    Multilocular with anechoic cystic components; posterior shadowing present. Size 29.0 mm x 15.0 mm x 21 mm. Mean 21.7 mm. Vol 4.783  cm  Left Ovary ======== Visualized, Enlarged. Size 31 mm x 17 mm x 21 mm No cysts identified Follicle(s)    Size 12.0 mm x 6.0 mm x 8.0 mm. Mean 8.7 mm. Vol 0.302 cm Heterogeneous ovarian tissue  Cul de Sac ========= Visualized. Mixed echogenicity. Free fluid visualized: mild Largest pool 20.0 mm x 11.0 mm x 23.0 mm. Vol 2.649 ml Free fluid seen in left and right PCDS'S  Impression ========= The uterus is surgically absent. Vaginal cuff appears grossly normal.  The ovaries appear enlarged; Right ovary contains a multilocular cyst described and measured in detail above; Left ovary contains a follicle measuring 12mm with heterogeneous ovarian tissue.  Free fluid seen in left and right posterior cul de sacs with measurements above.   Problem List: Patient  Active Problem List   Diagnosis Date Noted   Ovarian cyst 04/08/2024   Splenomegaly 12/05/2023    Normocytic anemia 12/05/2023   Elevated alkaline phosphatase level 12/05/2023   Macular hole 11/01/2015    Past Medical History: Past Medical History:  Diagnosis Date   Anemia    when she was younger   Basal cell carcinoma of neck    Cervical cancer (HCC)    GERD (gastroesophageal reflux disease)    Heart murmur    has had one, no problem with it in later years   History of gout    touch of gout   Hyperlipidemia    Hypertension    IBS (irritable bowel syndrome)    more on the constipation side   Macular degeneration of both eyes    Sinus headache    with seasonal allergies   Situational depression    in the past (11/01/2015)    Past Surgical History: Past Surgical History:  Procedure Laterality Date   25 GAUGE PARS PLANA VITRECTOMY WITH 20 GAUGE MVR PORT FOR MACULAR HOLE Left 11/01/2015   Procedure: LEFT EYE 25 GAUGE PARS PLANA VITRECTOMY WITH 20 GAUGE MVR PORT FOR MACULAR HOLE; INSERTION OF C3F8; HEADSCOPE LASER; SERUM PATCH ;  Surgeon: Norleen JONETTA Ku, MD;  Location: [facility] OR;  Service: Ophthalmology;  Laterality: Left;   BASAL CELL CARCINOMA EXCISION     neck   COLONOSCOPY     x 2   DILATION AND CURETTAGE OF UTERUS  X 1   EYE SURGERY     MEMBRANE PEEL Left 11/01/2015   Procedure: MEMBRANE PEEL LEFT EYE;  Surgeon: Norleen JONETTA Ku, MD;  Location: [facility] OR;  Service: Ophthalmology;  Laterality: Left;   PARS PLANA VITRECTOMY Left 11/01/2015   laser, membrane peel, gas fluid exchange, serum patch. C3F8 injection   VAGINAL HYSTERECTOMY       OB History:  G7P5   Family History: Family History  Problem Relation Age of Onset   Breast cancer Mother 66   Hypertension Mother    Cancer Father        ? cancerous polyp   COPD Father    Cancer Paternal Grandmother     Social History: Social History   Socioeconomic History   Marital status: Married    Spouse name: Not on file   Number of children: Not on file   Years of education: Not on file   Highest education level: Not  on file  Occupational History   Not on file  Tobacco Use   Smoking status: Never   Smokeless tobacco: Never  Substance and Sexual Activity   Alcohol use: No   Drug use: No   Sexual activity: Not Currently    Birth control/protection: None  Other Topics Concern   Not on file  Social History Narrative   ** Merged History Encounter **       Social Drivers of Health   Financial Resource Strain: Low Risk  (12/11/2023)   Received from [facility]   Overall Financial Resource Strain (CARDIA)    Difficulty of Paying Living Expenses: Not hard at all  Food Insecurity: No Food Insecurity (12/11/2023)   Received from [facility]   Hunger Vital Sign    Within the past 12 months, you worried that your food would run out before you got the money to buy more.: Never true    Within the past 12 months, the food you bought just didn't last and  you didn't have money to get more.: Never true  Transportation Needs: No Transportation Needs (12/11/2023)   Received from [facility] - Transportation    In the past 12 months, has lack of transportation kept you from medical appointments or from getting medications?: No    Lack of Transportation (Non-Medical): No  Physical Activity: Not on file  Stress: Not on file  Social Connections: Not on file  Intimate Partner Violence: Not At Risk (12/05/2023)   Humiliation, Afraid, Rape, and Kick questionnaire    Fear of Current or Ex-Partner: No    Emotionally Abused: No    Physically Abused: No    Sexually Abused: No    Allergies: No Known Allergies  Current Medications: Current Outpatient Medications  Medication Sig Dispense Refill   acetaminophen  (TYLENOL ) 500 MG tablet Take 500-1,000 mg by mouth daily as needed for mild pain.     allopurinol (ZYLOPRIM) 300 MG tablet Take by mouth.     ibuprofen  (ADVIL ,MOTRIN ) 200 MG tablet Take 200 mg by mouth daily as needed for moderate pain.      lisinopril  (PRINIVIL ,ZESTRIL ) 20 MG tablet Take 20 mg by mouth daily.     Multiple Vitamin (MULTIVITAMIN) tablet Take 1 tablet by mouth daily.     naproxen  sodium (ANAPROX ) 220 MG tablet Take 220 mg by mouth daily as needed (for pain).     pantoprazole  (PROTONIX ) 40 MG tablet Take by mouth.     sertraline  (ZOLOFT ) 50 MG tablet Take 50 mg by mouth every morning.     simvastatin  (ZOCOR ) 20 MG tablet Take 20 mg by mouth at bedtime.     traZODone (DESYREL) 50 MG tablet Take 1 tablet by mouth at bedtime.     triamterene -hydrochlorothiazide  (MAXZIDE -25) 37.5-25 MG tablet Take 1 tablet by mouth daily.     bacitracin -polymyxin b  (POLYSPORIN ) ophthalmic ointment Place 1 application into the left eye 3 (three) times daily. apply to eye every 12 hours while awake (Patient not taking: Reported on 12/23/2023) 3.5 g 0   Blood Glucose Monitoring Suppl (ACCU-CHEK GUIDE ME) w/Device KIT 1 each as directed     brimonidine  (ALPHAGAN ) 0.2 % ophthalmic solution Place 1 drop into the left eye 2 (two) times daily. (Patient not taking: Reported on 12/23/2023) 5 mL 12   gatifloxacin  (ZYMAXID ) 0.5 % SOLN Place 1 drop into the left eye 4 (four) times daily. (Patient not taking: Reported on 12/23/2023)     prednisoLONE  acetate (PRED FORTE ) 1 % ophthalmic suspension Place 1 drop into the left eye 4 (four) times daily. (Patient not taking: Reported on 12/23/2023) 5 mL 0   No current facility-administered medications for this visit.    Review of Systems General: negative for, fevers, chills, fatigue, changes in sleep, changes in weight or appetite Skin: negative for changes in color, texture, moles or lesions Eyes: negative for, changes in vision, pain, diplopia HEENT: negative for, change in hearing, pain, discharge, tinnitus, vertigo, voice changes, sore throat, neck masses Breasts: negative for breast lumps Pulmonary: negative for, dyspnea, orthopnea, productive cough Cardiac: negative for, palpitations, syncope, pain,  discomfort, pressure Gastrointestinal: negative for, dysphagia, nausea, vomiting, jaundice, pain, constipation, diarrhea, hematemesis, hematochezia Genitourinary/Sexual: negative for, dysuria, discharge, hesitancy, nocturia, retention, stones, infections, STD's, incontinence Musculoskeletal: negative for, pain, stiffness, swelling, range of motion limitation Hematology: negative for, easy bruising, bleeding Neurologic/Psych: negative for, headaches, seizures, paralysis, weakness, tremor, change in gait, change in sensation, mood swings, depression, anxiety, change in memory  Objective:  Physical Examination:  BP 115/67   Pulse 71   Temp 98.6 F (37 C)   Resp 20   Wt 128 lb 4.8 oz (58.2 kg)   SpO2 100%   BMI 23.47 kg/m    ECOG Performance Status: 1 - Symptomatic but completely ambulatory  General appearance: alert, cooperative, and appears stated age HEENT:PERRLA and neck supple with midline trachea Lymph node survey: non-palpable, axillary, inguinal, supraclavicular Cardiovascular: regular rate and rhythm, no murmurs or gallops Respiratory: normal air entry, lungs clear to auscultation and no rales, rhonchi or wheezing Abdomen: soft, nondistended, no hernias, and well healed incision Back: inspection of back is normal Extremities: extremities normal, atraumatic, no cyanosis or edema Skin exam - normal coloration and turgor, no rashes, no suspicious skin lesions noted. Neurological exam reveals alert, oriented, normal speech, no focal findings or movement disorder noted.  Pelvic: exam chaperoned by nurse;  Vulva: normal appearing vulva with no masses, tenderness or lesions; Vagina: normal vagina; Adnexa: no masses Rectal: confirms  Lab Review Labs on site today: Lab Results  Component Value Date   WBC 7.5 12/05/2023   HGB 11.6 (L) 12/05/2023   HCT 34.2 (L) 12/05/2023   MCV 83.4 12/05/2023   PLT 167 12/05/2023     Chemistry      Component Value Date/Time   NA 140  11/01/2015 1002   K 3.3 (L) 11/01/2015 1002   CL 102 11/01/2015 1002   CO2 27 11/01/2015 1002   BUN 18 11/01/2015 1002   CREATININE 0.83 11/01/2015 1002      Component Value Date/Time   CALCIUM 9.7 11/01/2015 1002         Assessment:  LINNIE DELGRANDE is a 73 y.o. female with pelvic pain diagnosed with right ovarian cyst/growth 2.5 cm with calcification on CT scan in 4/25.  ORADS 3 on MRI complex cystic lesion with several thin internal septations, measuring 3.5 x 2.6 x 1.9 cm. No thickened or nodular septations, enhancing soft tissue component, or internal fat identified. A portion of this lesion has a T2 dark peripheral rim, suggesting hemosiderin. This has characteristics of an O-RADS MRI 3 lesion 5% risk of cancer.  Left adnexa normal.  CA125 and HE4 normal, but ROMA 26%   Prior vaginal hysterectomy for cervical cancer 1983.   Medical co-morbidities complicating care: mild splenomegaly with negative work up recently. Normal blood counts, except mild anemia.  Plan:   Problem List Items Addressed This Visit       Endocrine   Ovarian cyst - Primary    We discussed options for management including LS BSO for probably benign, but painful ovaries.  Possible surgical staging with pelvic/PA nodes, omentectomy. Possible laparotomy.   The risks of surgery were discussed in detail and she understands these to include infection; wound separation; hernia; vaginal cuff separation, injury to adjacent organs such as bowel, bladder, blood vessels, ureters and nerves; bleeding which may require blood transfusion; anesthesia risk; thromboembolic events; possible death; unforeseen complications; possible need for re-exploration; medical complications such as heart attack, stroke, pleural effusion and pneumonia; and, if staging performed the risk of lymphedema and lymphocyst.  The patient will receive DVT and antibiotic prophylaxis as indicated.  She voiced a clear understanding.  She had the  opportunity to ask questions and written informed consent was obtained today.  Suggested return to clinic postop.    The patient's diagnosis, an outline of the further diagnostic and laboratory studies which will be required, the recommendation, and alternatives were discussed.  All questions  were answered to the patient's satisfaction.  A total of 40 minutes were spent with the patient/family today; 50% was spent in education, counseling and coordination of care for cystic ovarian mass and pain.    Prentice Agent, MD

## 2024-04-08 NOTE — H&P (View-Only) (Signed)
 Gynecologic Oncology H&P  Referring Provider: Dr. Lovetta  Chief Concern: right ovarian cyst  Subjective:  Julia Noble is a 73 y.o. female who is seen in consultation from Dr. Lovetta for small right adnexal mass.  Pelvic pain and scans done that showed small cystic mass in right adnexa.  11/21/23 CT scan FINDINGS: Lower chest: No acute abnormality.   Hepatobiliary: No focal liver abnormality is seen. No gallstones, gallbladder wall thickening, or biliary dilatation.   Pancreas: Unremarkable. No pancreatic ductal dilatation or surrounding inflammatory changes.   Spleen: Enlarged measuring 12.9 by 13.3 x 6 cm in the longitudinal, AP and transverse diameterVolume of 1029 g   Adrenals/Urinary Tract: Adrenal glands are unremarkable. Kidneys are normal, without renal calculi, focal lesion, or hydronephrosis. Bladder is unremarkable. 1.8 cm cyst of the lower pole right kidney.   Bosniak 1, No follow-up imaging is recommended.   JACR 2018 Feb; 264-273, Management of the Incidental Renal Mass on CT, [employer] 2021; 814-848, Bosniak Classification of Cystic Renal Masses, Version 2019.   Stomach/Bowel: Stomach is within normal limits. Appendix appears normal. No evidence of bowel wall thickening, distention, or inflammatory changes. Cecal appendix normal moderate amount of residual fecal material left side of the colon without diverticulitis   Vascular/Lymphatic:   Reproductive: Prior hysterectomy   Within the right side of the pelvis soft tissue nodular density measuring 2.3 x 1.9 cm could correlate with the right ovary. It has some calcifications along the lateral margin and 1 nodular calcification along the medial margin., calcifications are usually benign of no clinical significance and could be related to prior hemorrhagic cyst, could be related to endometriosis are other inflammatory changes.   Other: No abdominal wall hernia or abnormality. No  abdominopelvic ascites. Grade 1 posterior listhesis of L5 on L4.   Musculoskeletal:   IMPRESSION: *Splenomegaly. *Within the right side of the pelvis soft tissue nodular density measuring 2.3 x 1.9 cm could correlate with the right ovary. It has some calcifications along the lateral margin and 1 nodular calcification along the medial margin. Calcifications are usually benign of no clinical significance and could be related to prior hemorrhagic cyst, could be related to endometriosis are other inflammatory changes. *Grade 1 posterior listhesis  11/27/23 MRI FINDINGS: Lower Urinary Tract: No urinary bladder or urethral abnormality identified.   Bowel: Mild sigmoid diverticulosis, without signs of diverticulitis.   Vascular/Lymphatic: Unremarkable. No pathologically enlarged pelvic lymph nodes identified.   Reproductive:   -- Uterus: Prior hysterectomy. Vaginal cuff is unremarkable in appearance.   -- Right ovary: A small complex cystic lesion is seen with several thin internal septations, measuring 3.5 x 2.6 x 1.9 cm. No thickened or nodular septations, enhancing soft tissue component, or internal fat identified. A portion of this lesion has a T2 dark peripheral rim, suggesting hemosiderin. This has characteristics of an O-RADS MRI 3 lesion.   -- Left ovary: Appears normal. No ovarian or adnexal masses identified.   Other: No peritoneal thickening or abnormal free fluid.   Musculoskeletal:  Unremarkable.   IMPRESSION: 3.5 cm complex cystic lesion in the right ovary, with characteristics consistent with O-RADS MRI category 3 lesion. (Low risk; PPV for malignancy ~5%). Consider continued imaging follow-up by MRI in 6 months.   Prior vaginal hysterectomy for cervical cancer.   Mild sigmoid diverticulosis, without radiographic evidence of diverticulitis.  She saw Dr Lovetta 12/11/23 The patient is a 73 year old female with a history of cervical cancer who  presents with a h/o left pelvic pain.seen by  Dr Cleotilde who performed scans.  She has been experiencing left pelvic pain for several months, described as tender. A CT scan and MRI were performed, revealing a slightly enlarged spleen. She is uncertain about the evaluation plan for the spleen.  She underwent a partial hysterectomy in 1983 due to cervical cancer( reported not spread outside the cx ), with the uterus removed but the ovaries retained. She has had seven pregnancies, resulting in five children, with two miscarriages, all delivered vaginally.  She reports intentional weight loss of approximately 20 pounds, along with abdominal bloating for about a year and early satiety for six to eight months. No bleeding or problems with bowel movements. She believes her last colonoscopy was about ten years ago.  In the family history, her paternal grandmother had cervical cancer and other cancers, and her mother had a small cancerous mass on her left breast, which was successfully removed.  12/11/23 Normal CA125 12.8, HE4 93.7 ROMA calculated 26%  12/23/23 Seem by Dr Babara for mild splenomegaly.   Discussed with patient that differential diagnosis for splenomegaly is very broad, including infection, autoimmune disease, infiltrative disease [amyloidosis, sarcoidosis, storage disease], liver disease, myeloproliferative or lymphoproliferative disease. Labs reviewed and discussed with patient.  Normal LDH, negative HIV, negative hepatitis, negative JAK2 mutation with reflex, negative BCR-ABL 1 FISH, no M protein on protein electrophoresis, negative ANA, normal ESR, negative EBV, negative peripheral blood flow cytometry. CRP is elevated indicating underlying chronic inflammation. For now patient is asymptomatic.  I recommend observation.  12/26/23 TVUS showed 4 cm multilocular cyst in right ovary Endovaginal Imaging ================ Uterus ====== Surgically Absent  Right Ovary ========= Visualized, Enlarged.  Size 42 mm x 29 mm x 19 mm Cyst(s)    Size 41.0 mm x 20.0 mm x 35 mm. Mean 32.0 mm. Vol 15.027 cm. Multilocular with anechoic cystic components. Posterior acoustic       shadowing is noted. No vascularity on color Doppler.  Right Tube ========= Not visualized  Left Ovary ======== Visualized, Normal. Outline: smooth. Morphology: appropriate. Size 16 mm x 8 mm x 8 mm No cysts identified  Left Tube ======== Not visualized  Cul de Sac ========= Normal  Impression ========= The uterus is surgically absent. Vaginal/ Cervical cuff appears grossly normal. Right ovarian lesion is noted. Please see details above. Left ovary appears within normal limits. There are no unusual adnexal findings.   03/30/24 TVUS Endovaginal Imaging  Uterus ====== Not examined Surgically Absent  Right Ovary ========= Visualized, Enlarged. Outline: irregular. Size 36 mm x 25 mm x 15 mm Cyst(s)    Multilocular with anechoic cystic components; posterior shadowing present. Size 29.0 mm x 15.0 mm x 21 mm. Mean 21.7 mm. Vol 4.783  cm  Left Ovary ======== Visualized, Enlarged. Size 31 mm x 17 mm x 21 mm No cysts identified Follicle(s)    Size 12.0 mm x 6.0 mm x 8.0 mm. Mean 8.7 mm. Vol 0.302 cm Heterogeneous ovarian tissue  Cul de Sac ========= Visualized. Mixed echogenicity. Free fluid visualized: mild Largest pool 20.0 mm x 11.0 mm x 23.0 mm. Vol 2.649 ml Free fluid seen in left and right PCDS'S  Impression ========= The uterus is surgically absent. Vaginal cuff appears grossly normal.  The ovaries appear enlarged; Right ovary contains a multilocular cyst described and measured in detail above; Left ovary contains a follicle measuring 12mm with heterogeneous ovarian tissue.  Free fluid seen in left and right posterior cul de sacs with measurements above.   Problem List: Patient  Active Problem List   Diagnosis Date Noted   Ovarian cyst 04/08/2024   Splenomegaly 12/05/2023    Normocytic anemia 12/05/2023   Elevated alkaline phosphatase level 12/05/2023   Macular hole 11/01/2015    Past Medical History: Past Medical History:  Diagnosis Date   Anemia    when she was younger   Basal cell carcinoma of neck    Cervical cancer (HCC)    GERD (gastroesophageal reflux disease)    Heart murmur    has had one, no problem with it in later years   History of gout    touch of gout   Hyperlipidemia    Hypertension    IBS (irritable bowel syndrome)    more on the constipation side   Macular degeneration of both eyes    Sinus headache    with seasonal allergies   Situational depression    in the past (11/01/2015)    Past Surgical History: Past Surgical History:  Procedure Laterality Date   25 GAUGE PARS PLANA VITRECTOMY WITH 20 GAUGE MVR PORT FOR MACULAR HOLE Left 11/01/2015   Procedure: LEFT EYE 25 GAUGE PARS PLANA VITRECTOMY WITH 20 GAUGE MVR PORT FOR MACULAR HOLE; INSERTION OF C3F8; HEADSCOPE LASER; SERUM PATCH ;  Surgeon: Norleen JONETTA Ku, MD;  Location: [facility] OR;  Service: Ophthalmology;  Laterality: Left;   BASAL CELL CARCINOMA EXCISION     neck   COLONOSCOPY     x 2   DILATION AND CURETTAGE OF UTERUS  X 1   EYE SURGERY     MEMBRANE PEEL Left 11/01/2015   Procedure: MEMBRANE PEEL LEFT EYE;  Surgeon: Norleen JONETTA Ku, MD;  Location: [facility] OR;  Service: Ophthalmology;  Laterality: Left;   PARS PLANA VITRECTOMY Left 11/01/2015   laser, membrane peel, gas fluid exchange, serum patch. C3F8 injection   VAGINAL HYSTERECTOMY       OB History:  G7P5   Family History: Family History  Problem Relation Age of Onset   Breast cancer Mother 66   Hypertension Mother    Cancer Father        ? cancerous polyp   COPD Father    Cancer Paternal Grandmother     Social History: Social History   Socioeconomic History   Marital status: Married    Spouse name: Not on file   Number of children: Not on file   Years of education: Not on file   Highest education level: Not  on file  Occupational History   Not on file  Tobacco Use   Smoking status: Never   Smokeless tobacco: Never  Substance and Sexual Activity   Alcohol use: No   Drug use: No   Sexual activity: Not Currently    Birth control/protection: None  Other Topics Concern   Not on file  Social History Narrative   ** Merged History Encounter **       Social Drivers of Health   Financial Resource Strain: Low Risk  (12/11/2023)   Received from [facility]   Overall Financial Resource Strain (CARDIA)    Difficulty of Paying Living Expenses: Not hard at all  Food Insecurity: No Food Insecurity (12/11/2023)   Received from [facility]   Hunger Vital Sign    Within the past 12 months, you worried that your food would run out before you got the money to buy more.: Never true    Within the past 12 months, the food you bought just didn't last and  you didn't have money to get more.: Never true  Transportation Needs: No Transportation Needs (12/11/2023)   Received from [facility] - Transportation    In the past 12 months, has lack of transportation kept you from medical appointments or from getting medications?: No    Lack of Transportation (Non-Medical): No  Physical Activity: Not on file  Stress: Not on file  Social Connections: Not on file  Intimate Partner Violence: Not At Risk (12/05/2023)   Humiliation, Afraid, Rape, and Kick questionnaire    Fear of Current or Ex-Partner: No    Emotionally Abused: No    Physically Abused: No    Sexually Abused: No    Allergies: No Known Allergies  Current Medications: Current Outpatient Medications  Medication Sig Dispense Refill   acetaminophen  (TYLENOL ) 500 MG tablet Take 500-1,000 mg by mouth daily as needed for mild pain.     allopurinol (ZYLOPRIM) 300 MG tablet Take by mouth.     ibuprofen  (ADVIL ,MOTRIN ) 200 MG tablet Take 200 mg by mouth daily as needed for moderate pain.      lisinopril  (PRINIVIL ,ZESTRIL ) 20 MG tablet Take 20 mg by mouth daily.     Multiple Vitamin (MULTIVITAMIN) tablet Take 1 tablet by mouth daily.     naproxen  sodium (ANAPROX ) 220 MG tablet Take 220 mg by mouth daily as needed (for pain).     pantoprazole  (PROTONIX ) 40 MG tablet Take by mouth.     sertraline  (ZOLOFT ) 50 MG tablet Take 50 mg by mouth every morning.     simvastatin  (ZOCOR ) 20 MG tablet Take 20 mg by mouth at bedtime.     traZODone (DESYREL) 50 MG tablet Take 1 tablet by mouth at bedtime.     triamterene -hydrochlorothiazide  (MAXZIDE -25) 37.5-25 MG tablet Take 1 tablet by mouth daily.     bacitracin -polymyxin b  (POLYSPORIN ) ophthalmic ointment Place 1 application into the left eye 3 (three) times daily. apply to eye every 12 hours while awake (Patient not taking: Reported on 12/23/2023) 3.5 g 0   Blood Glucose Monitoring Suppl (ACCU-CHEK GUIDE ME) w/Device KIT 1 each as directed     brimonidine  (ALPHAGAN ) 0.2 % ophthalmic solution Place 1 drop into the left eye 2 (two) times daily. (Patient not taking: Reported on 12/23/2023) 5 mL 12   gatifloxacin  (ZYMAXID ) 0.5 % SOLN Place 1 drop into the left eye 4 (four) times daily. (Patient not taking: Reported on 12/23/2023)     prednisoLONE  acetate (PRED FORTE ) 1 % ophthalmic suspension Place 1 drop into the left eye 4 (four) times daily. (Patient not taking: Reported on 12/23/2023) 5 mL 0   No current facility-administered medications for this visit.    Review of Systems General: negative for, fevers, chills, fatigue, changes in sleep, changes in weight or appetite Skin: negative for changes in color, texture, moles or lesions Eyes: negative for, changes in vision, pain, diplopia HEENT: negative for, change in hearing, pain, discharge, tinnitus, vertigo, voice changes, sore throat, neck masses Breasts: negative for breast lumps Pulmonary: negative for, dyspnea, orthopnea, productive cough Cardiac: negative for, palpitations, syncope, pain,  discomfort, pressure Gastrointestinal: negative for, dysphagia, nausea, vomiting, jaundice, pain, constipation, diarrhea, hematemesis, hematochezia Genitourinary/Sexual: negative for, dysuria, discharge, hesitancy, nocturia, retention, stones, infections, STD's, incontinence Musculoskeletal: negative for, pain, stiffness, swelling, range of motion limitation Hematology: negative for, easy bruising, bleeding Neurologic/Psych: negative for, headaches, seizures, paralysis, weakness, tremor, change in gait, change in sensation, mood swings, depression, anxiety, change in memory  Objective:  Physical Examination:  BP 115/67   Pulse 71   Temp 98.6 F (37 C)   Resp 20   Wt 128 lb 4.8 oz (58.2 kg)   SpO2 100%   BMI 23.47 kg/m    ECOG Performance Status: 1 - Symptomatic but completely ambulatory  General appearance: alert, cooperative, and appears stated age HEENT:PERRLA and neck supple with midline trachea Lymph node survey: non-palpable, axillary, inguinal, supraclavicular Cardiovascular: regular rate and rhythm, no murmurs or gallops Respiratory: normal air entry, lungs clear to auscultation and no rales, rhonchi or wheezing Abdomen: soft, nondistended, no hernias, and well healed incision Back: inspection of back is normal Extremities: extremities normal, atraumatic, no cyanosis or edema Skin exam - normal coloration and turgor, no rashes, no suspicious skin lesions noted. Neurological exam reveals alert, oriented, normal speech, no focal findings or movement disorder noted.  Pelvic: exam chaperoned by nurse;  Vulva: normal appearing vulva with no masses, tenderness or lesions; Vagina: normal vagina; Adnexa: no masses Rectal: confirms  Lab Review Labs on site today: Lab Results  Component Value Date   WBC 7.5 12/05/2023   HGB 11.6 (L) 12/05/2023   HCT 34.2 (L) 12/05/2023   MCV 83.4 12/05/2023   PLT 167 12/05/2023     Chemistry      Component Value Date/Time   NA 140  11/01/2015 1002   K 3.3 (L) 11/01/2015 1002   CL 102 11/01/2015 1002   CO2 27 11/01/2015 1002   BUN 18 11/01/2015 1002   CREATININE 0.83 11/01/2015 1002      Component Value Date/Time   CALCIUM 9.7 11/01/2015 1002         Assessment:  Julia Noble is a 73 y.o. female with pelvic pain diagnosed with right ovarian cyst/growth 2.5 cm with calcification on CT scan in 4/25.  ORADS 3 on MRI complex cystic lesion with several thin internal septations, measuring 3.5 x 2.6 x 1.9 cm. No thickened or nodular septations, enhancing soft tissue component, or internal fat identified. A portion of this lesion has a T2 dark peripheral rim, suggesting hemosiderin. This has characteristics of an O-RADS MRI 3 lesion 5% risk of cancer.  Left adnexa normal.  CA125 and HE4 normal, but ROMA 26%   Prior vaginal hysterectomy for cervical cancer 1983.   Medical co-morbidities complicating care: mild splenomegaly with negative work up recently. Normal blood counts, except mild anemia.  Plan:   Problem List Items Addressed This Visit       Endocrine   Ovarian cyst - Primary    We discussed options for management including LS BSO for probably benign, but painful ovaries.  Possible surgical staging with pelvic/PA nodes, omentectomy. Possible laparotomy.   The risks of surgery were discussed in detail and she understands these to include infection; wound separation; hernia; vaginal cuff separation, injury to adjacent organs such as bowel, bladder, blood vessels, ureters and nerves; bleeding which may require blood transfusion; anesthesia risk; thromboembolic events; possible death; unforeseen complications; possible need for re-exploration; medical complications such as heart attack, stroke, pleural effusion and pneumonia; and, if staging performed the risk of lymphedema and lymphocyst.  The patient will receive DVT and antibiotic prophylaxis as indicated.  She voiced a clear understanding.  She had the  opportunity to ask questions and written informed consent was obtained today.  Suggested return to clinic postop.    The patient's diagnosis, an outline of the further diagnostic and laboratory studies which will be required, the recommendation, and alternatives were discussed.  All questions  were answered to the patient's satisfaction.  A total of 40 minutes were spent with the patient/family today; 50% was spent in education, counseling and coordination of care for cystic ovarian mass and pain.    Prentice Agent, MD

## 2024-04-08 NOTE — Progress Notes (Signed)
 Gynecologic Oncology Consult Visit   Referring Provider: Dr. Lovetta  Chief Concern: right ovarian cyst  Subjective:  Julia Noble is a 73 y.o. female who is seen in consultation from Dr. Lovetta for small right adnexal mass.  Pelvic pain and scans done that showed small cystic mass in right adnexa.  11/21/23 CT scan FINDINGS: Lower chest: No acute abnormality.   Hepatobiliary: No focal liver abnormality is seen. No gallstones, gallbladder wall thickening, or biliary dilatation.   Pancreas: Unremarkable. No pancreatic ductal dilatation or surrounding inflammatory changes.   Spleen: Enlarged measuring 12.9 by 13.3 x 6 cm in the longitudinal, AP and transverse diameterVolume of 1029 g   Adrenals/Urinary Tract: Adrenal glands are unremarkable. Kidneys are normal, without renal calculi, focal lesion, or hydronephrosis. Bladder is unremarkable. 1.8 cm cyst of the lower pole right kidney.   Bosniak 1, No follow-up imaging is recommended.   JACR 2018 Feb; 264-273, Management of the Incidental Renal Mass on CT, [employer] 2021; 814-848, Bosniak Classification of Cystic Renal Masses, Version 2019.   Stomach/Bowel: Stomach is within normal limits. Appendix appears normal. No evidence of bowel wall thickening, distention, or inflammatory changes. Cecal appendix normal moderate amount of residual fecal material left side of the colon without diverticulitis   Vascular/Lymphatic:   Reproductive: Prior hysterectomy   Within the right side of the pelvis soft tissue nodular density measuring 2.3 x 1.9 cm could correlate with the right ovary. It has some calcifications along the lateral margin and 1 nodular calcification along the medial margin., calcifications are usually benign of no clinical significance and could be related to prior hemorrhagic cyst, could be related to endometriosis are other inflammatory changes.   Other: No abdominal wall hernia or abnormality. No  abdominopelvic ascites. Grade 1 posterior listhesis of L5 on L4.   Musculoskeletal:   IMPRESSION: *Splenomegaly. *Within the right side of the pelvis soft tissue nodular density measuring 2.3 x 1.9 cm could correlate with the right ovary. It has some calcifications along the lateral margin and 1 nodular calcification along the medial margin. Calcifications are usually benign of no clinical significance and could be related to prior hemorrhagic cyst, could be related to endometriosis are other inflammatory changes. *Grade 1 posterior listhesis  11/27/23 MRI FINDINGS: Lower Urinary Tract: No urinary bladder or urethral abnormality identified.   Bowel: Mild sigmoid diverticulosis, without signs of diverticulitis.   Vascular/Lymphatic: Unremarkable. No pathologically enlarged pelvic lymph nodes identified.   Reproductive:   -- Uterus: Prior hysterectomy. Vaginal cuff is unremarkable in appearance.   -- Right ovary: A small complex cystic lesion is seen with several thin internal septations, measuring 3.5 x 2.6 x 1.9 cm. No thickened or nodular septations, enhancing soft tissue component, or internal fat identified. A portion of this lesion has a T2 dark peripheral rim, suggesting hemosiderin. This has characteristics of an O-RADS MRI 3 lesion.   -- Left ovary: Appears normal. No ovarian or adnexal masses identified.   Other: No peritoneal thickening or abnormal free fluid.   Musculoskeletal:  Unremarkable.   IMPRESSION: 3.5 cm complex cystic lesion in the right ovary, with characteristics consistent with O-RADS MRI category 3 lesion. (Low risk; PPV for malignancy ~5%). Consider continued imaging follow-up by MRI in 6 months.   Prior vaginal hysterectomy for cervical cancer.   Mild sigmoid diverticulosis, without radiographic evidence of diverticulitis.  She saw Dr Julia Noble The patient is a 73 year old female with a history of cervical cancer who  presents with a h/o left pelvic  pain.seen by Dr Julia Noble who performed scans.  She has been experiencing left pelvic pain for several months, described as tender. A CT scan and MRI were performed, revealing a slightly enlarged spleen. She is uncertain about the evaluation plan for the spleen.  She underwent a partial hysterectomy in 1983 due to cervical cancer( reported not spread outside the cx ), with the uterus removed but the ovaries retained. She has had seven pregnancies, resulting in five children, with two miscarriages, all delivered vaginally.  She reports intentional weight loss of approximately 20 pounds, along with abdominal bloating for about a year and early satiety for six to eight months. No bleeding or problems with bowel movements. She believes her last colonoscopy was about ten years ago.  In the family history, her paternal grandmother had cervical cancer and other cancers, and her mother had a small cancerous mass on her left breast, which was successfully removed.  Noble Normal CA125 12.8, HE4 93.7 ROMA calculated 26%  12/23/23 Seem by Dr Julia Noble for mild splenomegaly.   Discussed with patient that differential diagnosis for splenomegaly is very broad, including infection, autoimmune disease, infiltrative disease [amyloidosis, sarcoidosis, storage disease], liver disease, myeloproliferative or lymphoproliferative disease. Labs reviewed and discussed with patient.  Normal LDH, negative HIV, negative hepatitis, negative JAK2 mutation with reflex, negative BCR-ABL 1 FISH, no M protein on protein electrophoresis, negative ANA, normal ESR, negative EBV, negative peripheral blood flow cytometry. CRP is elevated indicating underlying chronic inflammation. For now patient is asymptomatic.  I recommend observation.  12/26/23 TVUS showed 4 cm multilocular cyst in right ovary Endovaginal Imaging ================ Uterus ====== Surgically Absent  Right Ovary ========= Visualized, Enlarged.  Size 42 mm x 29 mm x 19 mm Cyst(s)    Size 41.0 mm x 20.0 mm x 35 mm. Mean 32.0 mm. Vol 15.027 cm. Multilocular with anechoic cystic components. Posterior acoustic       shadowing is noted. No vascularity on color Doppler.  Right Tube ========= Not visualized  Left Ovary ======== Visualized, Normal. Outline: smooth. Morphology: appropriate. Size 16 mm x 8 mm x 8 mm No cysts identified  Left Tube ======== Not visualized  Cul de Sac ========= Normal  Impression ========= The uterus is surgically absent. Vaginal/ Cervical cuff appears grossly normal. Right ovarian lesion is noted. Please see details above. Left ovary appears within normal limits. There are no unusual adnexal findings.   03/30/24 TVUS Endovaginal Imaging  Uterus ====== Not examined Surgically Absent  Right Ovary ========= Visualized, Enlarged. Outline: irregular. Size 36 mm x 25 mm x 15 mm Cyst(s)    Multilocular with anechoic cystic components; posterior shadowing present. Size 29.0 mm x 15.0 mm x 21 mm. Mean 21.7 mm. Vol 4.783  cm  Left Ovary ======== Visualized, Enlarged. Size 31 mm x 17 mm x 21 mm No cysts identified Follicle(s)    Size 12.0 mm x 6.0 mm x 8.0 mm. Mean 8.7 mm. Vol 0.302 cm Heterogeneous ovarian tissue  Cul de Sac ========= Visualized. Mixed echogenicity. Free fluid visualized: mild Largest pool 20.0 mm x 11.0 mm x 23.0 mm. Vol 2.649 ml Free fluid seen in left and right PCDS'S  Impression ========= The uterus is surgically absent. Vaginal cuff appears grossly normal.  The ovaries appear enlarged; Right ovary contains a multilocular cyst described and measured in detail above; Left ovary contains a follicle measuring 12mm with heterogeneous ovarian tissue.  Free fluid seen in left and right posterior cul de sacs with measurements above.   Problem  List: Patient Active Problem List   Diagnosis Date Noted   Ovarian cyst 04/08/2024   Splenomegaly 12/05/2023    Normocytic anemia 12/05/2023   Elevated alkaline phosphatase level 12/05/2023   Macular hole 11/01/2015    Past Medical History: Past Medical History:  Diagnosis Date   Anemia    when she was younger   Basal cell carcinoma of neck    Cervical cancer (HCC)    GERD (gastroesophageal reflux disease)    Heart murmur    has had one, no problem with it in later years   History of gout    touch of gout   Hyperlipidemia    Hypertension    IBS (irritable bowel syndrome)    more on the constipation side   Macular degeneration of both eyes    Sinus headache    with seasonal allergies   Situational depression    in the past (11/01/2015)    Past Surgical History: Past Surgical History:  Procedure Laterality Date   25 GAUGE PARS PLANA VITRECTOMY WITH 20 GAUGE MVR PORT FOR MACULAR HOLE Left 11/01/2015   Procedure: LEFT EYE 25 GAUGE PARS PLANA VITRECTOMY WITH 20 GAUGE MVR PORT FOR MACULAR HOLE; INSERTION OF C3F8; HEADSCOPE LASER; SERUM PATCH ;  Surgeon: Norleen JONETTA Ku, MD;  Location: [facility] OR;  Service: Ophthalmology;  Laterality: Left;   BASAL CELL CARCINOMA EXCISION     neck   COLONOSCOPY     x 2   DILATION AND CURETTAGE OF UTERUS  X 1   EYE SURGERY     MEMBRANE PEEL Left 11/01/2015   Procedure: MEMBRANE PEEL LEFT EYE;  Surgeon: Norleen JONETTA Ku, MD;  Location: [facility] OR;  Service: Ophthalmology;  Laterality: Left;   PARS PLANA VITRECTOMY Left 11/01/2015   laser, membrane peel, gas fluid exchange, serum patch. C3F8 injection   VAGINAL HYSTERECTOMY       OB History:  G7P5   Family History: Family History  Problem Relation Age of Onset   Breast cancer Mother 47   Hypertension Mother    Cancer Father        ? cancerous polyp   COPD Father    Cancer Paternal Grandmother     Social History: Social History   Socioeconomic History   Marital status: Married    Spouse name: Not on file   Number of children: Not on file   Years of education: Not on file   Highest education level: Not  on file  Occupational History   Not on file  Tobacco Use   Smoking status: Never   Smokeless tobacco: Never  Substance and Sexual Activity   Alcohol use: No   Drug use: No   Sexual activity: Not Currently    Birth control/protection: None  Other Topics Concern   Not on file  Social History Narrative   ** Merged History Encounter **       Social Drivers of Health   Financial Resource Strain: Low Risk  (12/11/2023)   Received from [facility]   Overall Financial Resource Strain (CARDIA)    Difficulty of Paying Living Expenses: Not hard at all  Food Insecurity: No Food Insecurity (12/11/2023)   Received from [facility]   Hunger Vital Sign    Within the past 12 months, you worried that your food would run out before you got the money to buy more.: Never true    Within the past 12 months, the food you bought just didn't  last and you didn't have money to get more.: Never true  Transportation Needs: No Transportation Needs (12/11/2023)   Received from [facility] - Transportation    In the past 12 months, has lack of transportation kept you from medical appointments or from getting medications?: No    Lack of Transportation (Non-Medical): No  Physical Activity: Not on file  Stress: Not on file  Social Connections: Not on file  Intimate Partner Violence: Not At Risk (12/05/2023)   Humiliation, Afraid, Rape, and Kick questionnaire    Fear of Current or Ex-Partner: No    Emotionally Abused: No    Physically Abused: No    Sexually Abused: No    Allergies: No Known Allergies  Current Medications: Current Outpatient Medications  Medication Sig Dispense Refill   acetaminophen  (TYLENOL ) 500 MG tablet Take 500-1,000 mg by mouth daily as needed for mild pain.     allopurinol (ZYLOPRIM) 300 MG tablet Take by mouth.     ibuprofen  (ADVIL ,MOTRIN ) 200 MG tablet Take 200 mg by mouth daily as needed for moderate pain.      lisinopril  (PRINIVIL ,ZESTRIL ) 20 MG tablet Take 20 mg by mouth daily.     Multiple Vitamin (MULTIVITAMIN) tablet Take 1 tablet by mouth daily.     naproxen  sodium (ANAPROX ) 220 MG tablet Take 220 mg by mouth daily as needed (for pain).     pantoprazole  (PROTONIX ) 40 MG tablet Take by mouth.     sertraline  (ZOLOFT ) 50 MG tablet Take 50 mg by mouth every morning.     simvastatin  (ZOCOR ) 20 MG tablet Take 20 mg by mouth at bedtime.     traZODone (DESYREL) 50 MG tablet Take 1 tablet by mouth at bedtime.     triamterene -hydrochlorothiazide  (MAXZIDE -25) 37.5-25 MG tablet Take 1 tablet by mouth daily.     bacitracin -polymyxin b  (POLYSPORIN ) ophthalmic ointment Place 1 application into the left eye 3 (three) times daily. apply to eye every 12 hours while awake (Patient not taking: Reported on 12/23/2023) 3.5 g 0   Blood Glucose Monitoring Suppl (ACCU-CHEK GUIDE ME) w/Device KIT 1 each as directed     brimonidine  (ALPHAGAN ) 0.2 % ophthalmic solution Place 1 drop into the left eye 2 (two) times daily. (Patient not taking: Reported on 12/23/2023) 5 mL 12   gatifloxacin  (ZYMAXID ) 0.5 % SOLN Place 1 drop into the left eye 4 (four) times daily. (Patient not taking: Reported on 12/23/2023)     prednisoLONE  acetate (PRED FORTE ) 1 % ophthalmic suspension Place 1 drop into the left eye 4 (four) times daily. (Patient not taking: Reported on 12/23/2023) 5 mL 0   No current facility-administered medications for this visit.    Review of Systems General: negative for, fevers, chills, fatigue, changes in sleep, changes in weight or appetite Skin: negative for changes in color, texture, moles or lesions Eyes: negative for, changes in vision, pain, diplopia HEENT: negative for, change in hearing, pain, discharge, tinnitus, vertigo, voice changes, sore throat, neck masses Breasts: negative for breast lumps Pulmonary: negative for, dyspnea, orthopnea, productive cough Cardiac: negative for, palpitations, syncope, pain,  discomfort, pressure Gastrointestinal: negative for, dysphagia, nausea, vomiting, jaundice, pain, constipation, diarrhea, hematemesis, hematochezia Genitourinary/Sexual: negative for, dysuria, discharge, hesitancy, nocturia, retention, stones, infections, STD's, incontinence Musculoskeletal: negative for, pain, stiffness, swelling, range of motion limitation Hematology: negative for, easy bruising, bleeding Neurologic/Psych: negative for, headaches, seizures, paralysis, weakness, tremor, change in gait, change in sensation, mood swings, depression, anxiety, change in memory  Objective:  Physical Examination:  BP 115/67   Pulse 71   Temp 98.6 F (37 C)   Resp 20   Wt 128 lb 4.8 oz (58.2 kg)   SpO2 100%   BMI 23.47 kg/m    ECOG Performance Status: 1 - Symptomatic but completely ambulatory  General appearance: alert, cooperative, and appears stated age HEENT:PERRLA and neck supple with midline trachea Lymph node survey: non-palpable, axillary, inguinal, supraclavicular Cardiovascular: regular rate and rhythm, no murmurs or gallops Respiratory: normal air entry, lungs clear to auscultation and no rales, rhonchi or wheezing Abdomen: soft, nondistended, no hernias, and well healed incision Back: inspection of back is normal Extremities: extremities normal, atraumatic, no cyanosis or edema Skin exam - normal coloration and turgor, no rashes, no suspicious skin lesions noted. Neurological exam reveals alert, oriented, normal speech, no focal findings or movement disorder noted.  Pelvic: exam chaperoned by nurse;  Vulva: normal appearing vulva with no masses, tenderness or lesions; Vagina: normal vagina; Adnexa: no masses Rectal: confirms  Lab Review Labs on site today: Lab Results  Component Value Date   WBC 7.5 12/05/2023   HGB 11.6 (L) 12/05/2023   HCT 34.2 (L) 12/05/2023   MCV 83.4 12/05/2023   PLT 167 12/05/2023     Chemistry      Component Value Date/Time   NA 140  11/01/2015 1002   K 3.3 (L) 11/01/2015 1002   CL 102 11/01/2015 1002   CO2 27 11/01/2015 1002   BUN 18 11/01/2015 1002   CREATININE 0.83 11/01/2015 1002      Component Value Date/Time   CALCIUM 9.7 11/01/2015 1002         Assessment:  FAELYNN WYNDER is a 73 y.o. female with pelvic pain diagnosed with right ovarian cyst/growth 2.5 cm with calcification on CT scan in 4/25.  ORADS 3 on MRI complex cystic lesion with several thin internal septations, measuring 3.5 x 2.6 x 1.9 cm. No thickened or nodular septations, enhancing soft tissue component, or internal fat identified. A portion of this lesion has a T2 dark peripheral rim, suggesting hemosiderin. This has characteristics of an O-RADS MRI 3 lesion 5% risk of cancer.  Left adnexa normal.  CA125 and HE4 normal, but ROMA 26%.    Prior vaginal hysterectomy for cervical cancer 1983.   Medical co-morbidities complicating care: mild splenomegaly with negative work up recently. Normal blood counts, except mild anemia.  Plan:   Problem List Items Addressed This Visit       Endocrine   Ovarian cyst - Primary   Repeat CA125 and HE4 today.   We discussed options for management including watchful waiting in view of low risk of cancer based on ORADS 3.  However, she is concerned about discomfort and prefers to have LS BSO for due to pain and possible cancer risk on 04/22/24.  Possible surgical staging with pelvic/PA nodes, omentectomy. Possible laparotomy.   The risks of surgery were discussed in detail and she understands these to include infection; wound separation; hernia; vaginal cuff separation, injury to adjacent organs such as bowel, bladder, blood vessels, ureters and nerves; bleeding which may require blood transfusion; anesthesia risk; thromboembolic events; possible death; unforeseen complications; possible need for re-exploration; medical complications such as heart attack, stroke, pleural effusion and pneumonia; and, if staging  performed the risk of lymphedema and lymphocyst.  The patient will receive DVT and antibiotic prophylaxis as indicated.  She voiced a clear understanding.  She had the opportunity to ask questions and written informed  consent was obtained today.  Suggested return to clinic postop.    The patient's diagnosis, an outline of the further diagnostic and laboratory studies which will be required, the recommendation, and alternatives were discussed.  All questions were answered to the patient's satisfaction.  A total of 40 minutes were spent with the patient/family today; 50% was spent in education, counseling and coordination of care for cystic ovarian mass and pain.    Prentice Agent, MD  CC:  Julia Noble, Julia Noble PARAS, MD [address] [facility] [place],  [place] [address] [phone]

## 2024-04-09 ENCOUNTER — Telehealth: Payer: Self-pay

## 2024-04-09 LAB — HUMAN EPIDIDYMIS PROT 4,SERIAL: HE4: 94.7 pmol/L (ref 0.0–96.9)

## 2024-04-09 LAB — CA 125: Cancer Antigen (CA) 125: 12.3 U/mL (ref 0.0–38.1)

## 2024-04-09 NOTE — Telephone Encounter (Signed)
 Surgery has been scheduled for 04/22/24. Called and confirmed with Ms. Molino. Provided post operative appointment. She has received PAT appointment for 8/20. Encouraged to call with any needs.

## 2024-04-15 ENCOUNTER — Encounter
Admission: RE | Admit: 2024-04-15 | Discharge: 2024-04-15 | Disposition: A | Discharge status: Home / Self Care | Payer: Medicare (Managed Care) | Source: Ambulatory Visit | Attending: Obstetrics and Gynecology | Admitting: Obstetrics and Gynecology

## 2024-04-15 ENCOUNTER — Other Ambulatory Visit: Payer: Self-pay

## 2024-04-15 DIAGNOSIS — Z01810 Encounter for preprocedural cardiovascular examination: Secondary | ICD-10-CM

## 2024-04-15 HISTORY — DX: Other seasonal allergic rhinitis: J30.2

## 2024-04-15 HISTORY — DX: Prediabetes: R73.03

## 2024-04-15 HISTORY — DX: Irritable bowel syndrome with constipation: K58.1

## 2024-04-15 NOTE — Patient Instructions (Addendum)
 Your procedure is scheduled on: 04/22/24 - Wednesday Report to the Registration Desk on the 1st floor of the [facility]. To find out your arrival time, please call [phone] between 1PM - 3PM on: 04/21/24 - Tuesday If your arrival time is 6:00 am, do not arrive before that time as the [facility] entrance doors do not open until 6:00 am.  REMEMBER: Instructions that are not followed completely may result in serious medical risk, up to and including death; or upon the discretion of your surgeon and anesthesiologist your surgery may need to be rescheduled.  Do not eat food after midnight the night before surgery.  No gum chewing or hard candies.  You may however, drink CLEAR liquids up to 2 hours before you are scheduled to arrive for your surgery. Do not drink anything within 2 hours of your scheduled arrival time.  Clear liquids include: - water   - apple juice without pulp - gatorade (not RED colors) - black coffee or tea (Do NOT add milk or creamers to the coffee or tea) Do NOT drink anything that is not on this list.  One week prior to surgery: Stop Anti-inflammatories (NSAIDS) such as Advil , Aleve , Ibuprofen , Motrin , Naproxen , Naprosyn  and Aspirin based products such as Excedrin, Goody's Powder, BC Powder. You may continue to take Tylenol  if needed for pain up until the day of surgery.  Stop ANY OVER THE COUNTER supplements until after surgery.  ON THE DAY OF SURGERY ONLY TAKE THESE MEDICATIONS WITH SIPS OF WATER :  pantoprazole  (PROTONIX )  sertraline  (ZOLOFT )    No Alcohol for 24 hours before or after surgery.  No Smoking including e-cigarettes for 24 hours before surgery.  No chewable tobacco products for at least 6 hours before surgery.  No nicotine patches on the day of surgery.  Do not use any recreational drugs for at least a week (preferably 2 weeks) before your surgery.  Please be advised that the combination of cocaine and anesthesia may have negative  outcomes, up to and including death. If you test positive for cocaine, your surgery will be cancelled.  On the morning of surgery brush your teeth with toothpaste and water , you may rinse your mouth with mouthwash if you wish. Do not swallow any toothpaste or mouthwash.  Use CHG Soap or wipes as directed on instruction sheet.  Do not wear jewelry, make-up, hairpins, clips or nail polish.  For welded (permanent) jewelry: bracelets, anklets, waist bands, etc.  Please have this removed prior to surgery.  If it is not removed, there is a chance that hospital personnel will need to cut it off on the day of surgery.  Do not wear lotions, powders, or perfumes.   Do not shave body hair from the neck down 48 hours before surgery.  Contact lenses, hearing aids and dentures may not be worn into surgery.  Do not bring valuables to the hospital. [facility] is not responsible for any missing/lost belongings or valuables.   Notify your doctor if there is any change in your medical condition (cold, fever, infection).  Wear comfortable clothing (specific to your surgery type) to the hospital.  After surgery, you can help prevent lung complications by doing breathing exercises.  Take deep breaths and cough every 1-2 hours. Your doctor may order a device called an Incentive Spirometer to help you take deep breaths.  When coughing or sneezing, hold a pillow firmly against your incision with both hands. This is called "splinting." Doing this helps protect your incision.  It also decreases belly discomfort.  If you are being admitted to the hospital overnight, leave your suitcase in the car. After surgery it may be brought to your room.  In case of increased patient census, it may be necessary for you, the patient, to continue your postoperative care in the Same Day Surgery department.  If you are being discharged the day of surgery, you will not be allowed to drive home. You will need a responsible  individual to drive you home and stay with you for 24 hours after surgery.   If you are taking public transportation, you will need to have a responsible individual with you.  Please call the Pre-admissions Testing Dept. at [phone] if you have any questions about these instructions.  Surgery Visitation Policy:  Patients having surgery or a procedure may have two visitors.  Children under the age of 64 must have an adult with them who is not the patient.  Inpatient Visitation:    Visiting hours are 7 a.m. to 8 p.m. Up to four visitors are allowed at one time in a patient room. The visitors may rotate out with other people during the day.  One visitor age 5 or older may stay with the patient overnight and must be in the room by 8 p.m.   Merchandiser, retail to address health-related social needs:  [email or web address]     Preparing for Surgery with CHLORHEXIDINE GLUCONATE (CHG) Soap  Chlorhexidine Gluconate (CHG) Soap  o An antiseptic cleaner that kills germs and bonds with the skin to continue killing germs even after washing  o Used for showering the night before surgery and morning of surgery  Before surgery, you can play an important role by reducing the number of germs on your skin.  CHG (Chlorhexidine gluconate) soap is an antiseptic cleanser which kills germs and bonds with the skin to continue killing germs even after washing.  Please do not use if you have an allergy to CHG or antibacterial soaps. If your skin becomes reddened/irritated stop using the CHG.  1. Shower the NIGHT BEFORE SURGERY and the MORNING OF SURGERY with CHG soap.  2. If you choose to wash your hair, wash your hair first as usual with your normal shampoo.  3. After shampooing, rinse your hair and body thoroughly to remove the shampoo.  4. Use CHG as you would any other liquid soap. You can apply CHG directly to the skin and wash gently with a scrungie or a clean  washcloth.  5. Apply the CHG soap to your body only from the neck down. Do not use on open wounds or open sores. Avoid contact with your eyes, ears, mouth, and genitals (private parts). Wash face and genitals (private parts) with your normal soap.  6. Wash thoroughly, paying special attention to the area where your surgery will be performed.  7. Thoroughly rinse your body with warm water .  8. Do not shower/wash with your normal soap after using and rinsing off the CHG soap.  9. Pat yourself dry with a clean towel.  10. Wear clean pajamas to bed the night before surgery.  12. Place clean sheets on your bed the night of your first shower and do not sleep with pets.  13. Shower again with the CHG soap on the day of surgery prior to arriving at the hospital.  14. Do not apply any deodorants/lotions/powders.  15. Please wear clean clothes to the hospital.

## 2024-04-17 ENCOUNTER — Encounter
Admission: RE | Admit: 2024-04-17 | Discharge: 2024-04-17 | Disposition: A | Discharge status: Home / Self Care | Payer: Medicare (Managed Care) | Source: Ambulatory Visit | Attending: Obstetrics and Gynecology | Admitting: Obstetrics and Gynecology

## 2024-04-17 DIAGNOSIS — N83201 Unspecified ovarian cyst, right side: Secondary | ICD-10-CM | POA: Insufficient documentation

## 2024-04-17 DIAGNOSIS — Z01818 Encounter for other preprocedural examination: Secondary | ICD-10-CM | POA: Insufficient documentation

## 2024-04-17 DIAGNOSIS — Z01810 Encounter for preprocedural cardiovascular examination: Secondary | ICD-10-CM

## 2024-04-17 LAB — TYPE AND SCREEN
ABO/RH(D): O POS
Antibody Screen: NEGATIVE

## 2024-04-17 LAB — CBC WITH DIFFERENTIAL/PLATELET
Abs Immature Granulocytes: 0.02 K/uL (ref 0.00–0.07)
Basophils Absolute: 0 K/uL (ref 0.0–0.1)
Basophils Relative: 1 %
Eosinophils Absolute: 0.2 K/uL (ref 0.0–0.5)
Eosinophils Relative: 3 %
HCT: 33.9 % — ABNORMAL LOW (ref 36.0–46.0)
Hemoglobin: 11.2 g/dL — ABNORMAL LOW (ref 12.0–15.0)
Immature Granulocytes: 0 %
Lymphocytes Relative: 21 %
Lymphs Abs: 1.3 K/uL (ref 0.7–4.0)
MCH: 27.8 pg (ref 26.0–34.0)
MCHC: 33 g/dL (ref 30.0–36.0)
MCV: 84.1 fL (ref 80.0–100.0)
Monocytes Absolute: 0.3 K/uL (ref 0.1–1.0)
Monocytes Relative: 4 %
Neutro Abs: 4.2 K/uL (ref 1.7–7.7)
Neutrophils Relative %: 71 %
Platelets: 160 K/uL (ref 150–400)
RBC: 4.03 MIL/uL (ref 3.87–5.11)
RDW: 13.4 % (ref 11.5–15.5)
WBC: 5.9 K/uL (ref 4.0–10.5)
nRBC: 0 % (ref 0.0–0.2)

## 2024-04-17 LAB — COMPREHENSIVE METABOLIC PANEL WITH GFR
ALT: 13 U/L (ref 0–44)
AST: 22 U/L (ref 15–41)
Albumin: 4 g/dL (ref 3.5–5.0)
Alkaline Phosphatase: 87 U/L (ref 38–126)
Anion gap: 11 (ref 5–15)
BUN: 18 mg/dL (ref 8–23)
CO2: 28 mmol/L (ref 22–32)
Calcium: 9.3 mg/dL (ref 8.9–10.3)
Chloride: 100 mmol/L (ref 98–111)
Creatinine, Ser: 0.97 mg/dL (ref 0.44–1.00)
GFR, Estimated: >60 mL/min (ref >60)
Glucose, Bld: 87 mg/dL (ref 70–99)
Potassium: 3.7 mmol/L (ref 3.5–5.1)
Sodium: 139 mmol/L (ref 135–145)
Total Bilirubin: 0.5 mg/dL (ref 0.0–1.2)
Total Protein: 6.7 g/dL (ref 6.5–8.1)

## 2024-04-22 ENCOUNTER — Encounter
Admission: RE | Disposition: A | Discharge status: Home / Self Care | Payer: Self-pay | Source: Home / Self Care | Attending: Obstetrics and Gynecology

## 2024-04-22 ENCOUNTER — Ambulatory Visit
Admission: RE | Admit: 2024-04-22 | Discharge: 2024-04-22 | Disposition: A | Discharge status: Home / Self Care | Payer: Medicare (Managed Care) | Attending: Obstetrics and Gynecology | Admitting: Obstetrics and Gynecology

## 2024-04-22 ENCOUNTER — Ambulatory Visit: Payer: Medicare (Managed Care) | Admitting: Anesthesiology

## 2024-04-22 ENCOUNTER — Ambulatory Visit: Payer: Medicare (Managed Care) | Admitting: Urgent Care

## 2024-04-22 ENCOUNTER — Other Ambulatory Visit: Payer: Self-pay

## 2024-04-22 ENCOUNTER — Encounter: Payer: Self-pay | Admitting: Obstetrics and Gynecology

## 2024-04-22 DIAGNOSIS — Z8541 Personal history of malignant neoplasm of cervix uteri: Secondary | ICD-10-CM | POA: Insufficient documentation

## 2024-04-22 DIAGNOSIS — K5730 Diverticulosis of large intestine without perforation or abscess without bleeding: Secondary | ICD-10-CM | POA: Diagnosis not present

## 2024-04-22 DIAGNOSIS — N83209 Unspecified ovarian cyst, unspecified side: Secondary | ICD-10-CM | POA: Diagnosis present

## 2024-04-22 DIAGNOSIS — N838 Other noninflammatory disorders of ovary, fallopian tube and broad ligament: Secondary | ICD-10-CM

## 2024-04-22 DIAGNOSIS — I10 Essential (primary) hypertension: Secondary | ICD-10-CM | POA: Insufficient documentation

## 2024-04-22 DIAGNOSIS — K219 Gastro-esophageal reflux disease without esophagitis: Secondary | ICD-10-CM | POA: Diagnosis not present

## 2024-04-22 DIAGNOSIS — R7303 Prediabetes: Secondary | ICD-10-CM | POA: Diagnosis not present

## 2024-04-22 DIAGNOSIS — E785 Hyperlipidemia, unspecified: Secondary | ICD-10-CM | POA: Insufficient documentation

## 2024-04-22 DIAGNOSIS — D271 Benign neoplasm of left ovary: Secondary | ICD-10-CM | POA: Insufficient documentation

## 2024-04-22 DIAGNOSIS — D270 Benign neoplasm of right ovary: Secondary | ICD-10-CM | POA: Diagnosis not present

## 2024-04-22 DIAGNOSIS — N83201 Unspecified ovarian cyst, right side: Secondary | ICD-10-CM

## 2024-04-22 HISTORY — PX: LAPAROSCOPIC BILATERAL SALPINGO OOPHERECTOMY: SHX5890

## 2024-04-22 SURGERY — SALPINGO-OOPHORECTOMY, BILATERAL, LAPAROSCOPIC
Anesthesia: General | Laterality: Bilateral

## 2024-04-22 MED ORDER — ROCURONIUM BROMIDE 100 MG/10ML IV SOLN
INTRAVENOUS | Status: DC | PRN
  Administered 2024-04-22: 50 mg via INTRAVENOUS

## 2024-04-22 MED ORDER — PROPOFOL 10 MG/ML IV BOLUS
INTRAVENOUS | Status: DC | PRN
Start: 2024-04-22 — End: 2024-04-22
  Administered 2024-04-22: 120 mg via INTRAVENOUS

## 2024-04-22 MED ORDER — HEPARIN SODIUM (PORCINE) 5000 UNIT/ML IJ SOLN
5000.0000 [IU] | Freq: Once | INTRAMUSCULAR | Status: AC
  Administered 2024-04-22: 5000 [IU] via SUBCUTANEOUS

## 2024-04-22 MED ORDER — SUGAMMADEX SODIUM 200 MG/2ML IV SOLN
INTRAVENOUS | Status: DC | PRN
  Administered 2024-04-22: 200 mg via INTRAVENOUS

## 2024-04-22 MED ORDER — FENTANYL CITRATE (PF) 100 MCG/2ML IJ SOLN
INTRAMUSCULAR | Status: DC | PRN
  Administered 2024-04-22 (×2): 50 ug via INTRAVENOUS

## 2024-04-22 MED ORDER — CHLORHEXIDINE GLUCONATE CLOTH 2 % EX PADS: 6.0000 | MEDICATED_PAD | Freq: Once | CUTANEOUS | Status: DC

## 2024-04-22 MED ORDER — OXYCODONE HCL 5 MG PO TABS
ORAL_TABLET | ORAL | Status: AC
  Filled 2024-04-22: qty 1

## 2024-04-22 MED ORDER — CHLORHEXIDINE GLUCONATE 0.12 % MT SOLN
15.0000 mL | Freq: Once | OROMUCOSAL | Status: AC
  Administered 2024-04-22: 15 mL via OROMUCOSAL

## 2024-04-22 MED ORDER — INDOCYANINE GREEN 25 MG IV SOLR
INTRAVENOUS | Status: AC
Start: 2024-04-22 — End: 2024-04-22
  Filled 2024-04-22: qty 10

## 2024-04-22 MED ORDER — CELECOXIB 200 MG PO CAPS
ORAL_CAPSULE | ORAL | Status: AC
Start: 2024-04-22 — End: 2024-04-22
  Filled 2024-04-22: qty 1

## 2024-04-22 MED ORDER — ACETAMINOPHEN 500 MG PO TABS
1000.0000 mg | ORAL_TABLET | Freq: Once | ORAL | Status: AC
  Administered 2024-04-22: 1000 mg via ORAL

## 2024-04-22 MED ORDER — FENTANYL CITRATE (PF) 100 MCG/2ML IJ SOLN
INTRAMUSCULAR | Status: AC
  Filled 2024-04-22: qty 2

## 2024-04-22 MED ORDER — BUPIVACAINE HCL (PF) 0.5 % IJ SOLN
INTRAMUSCULAR | Status: AC
  Filled 2024-04-22: qty 30

## 2024-04-22 MED ORDER — FENTANYL CITRATE (PF) 100 MCG/2ML IJ SOLN
25.0000 ug | INTRAMUSCULAR | Status: DC | PRN
  Administered 2024-04-22 (×2): 50 ug via INTRAVENOUS

## 2024-04-22 MED ORDER — HEPARIN SODIUM (PORCINE) 5000 UNIT/ML IJ SOLN
INTRAMUSCULAR | Status: AC
  Filled 2024-04-22: qty 1

## 2024-04-22 MED ORDER — PROPOFOL 1000 MG/100ML IV EMUL
INTRAVENOUS | Status: AC
  Filled 2024-04-22: qty 100

## 2024-04-22 MED ORDER — HYDROCODONE-ACETAMINOPHEN 5-325 MG PO TABS: 1.0000 | ORAL_TABLET | ORAL | 0 refills | Status: AC | PRN

## 2024-04-22 MED ORDER — OXYCODONE HCL 5 MG PO TABS
5.0000 mg | ORAL_TABLET | Freq: Once | ORAL | Status: AC | PRN
  Administered 2024-04-22: 5 mg via ORAL

## 2024-04-22 MED ORDER — LACTATED RINGERS IV SOLN: INTRAVENOUS | Status: DC

## 2024-04-22 MED ORDER — PROPOFOL 500 MG/50ML IV EMUL
INTRAVENOUS | Status: DC | PRN
  Administered 2024-04-22: 125 ug/kg/min via INTRAVENOUS

## 2024-04-22 MED ORDER — CHLORHEXIDINE GLUCONATE 0.12 % MT SOLN
OROMUCOSAL | Status: AC
  Filled 2024-04-22: qty 15

## 2024-04-22 MED ORDER — ORAL CARE MOUTH RINSE: 15.0000 mL | Freq: Once | OROMUCOSAL | Status: AC

## 2024-04-22 MED ORDER — ACETAMINOPHEN 500 MG PO TABS
ORAL_TABLET | ORAL | Status: AC
  Filled 2024-04-22: qty 2

## 2024-04-22 MED ORDER — GLYCOPYRROLATE 0.2 MG/ML IJ SOLN
INTRAMUSCULAR | Status: DC | PRN
  Administered 2024-04-22: .2 mg via INTRAVENOUS

## 2024-04-22 MED ORDER — ONDANSETRON HCL 4 MG/2ML IJ SOLN
INTRAMUSCULAR | Status: DC | PRN
  Administered 2024-04-22: 4 mg via INTRAVENOUS

## 2024-04-22 MED ORDER — METHYLENE BLUE (ANTIDOTE) 1 % IV SOLN
INTRAVENOUS | Status: AC
  Filled 2024-04-22: qty 10

## 2024-04-22 MED ORDER — CELECOXIB 200 MG PO CAPS
200.0000 mg | ORAL_CAPSULE | Freq: Once | ORAL | Status: AC
  Administered 2024-04-22: 200 mg via ORAL

## 2024-04-22 MED ORDER — LIDOCAINE HCL (CARDIAC) PF 100 MG/5ML IV SOSY
PREFILLED_SYRINGE | INTRAVENOUS | Status: DC | PRN
  Administered 2024-04-22: 100 mg via INTRAVENOUS

## 2024-04-22 MED ORDER — ACETAMINOPHEN 10 MG/ML IV SOLN: 1000.0000 mg | Freq: Once | INTRAVENOUS | Status: DC | PRN

## 2024-04-22 MED ORDER — DROPERIDOL 2.5 MG/ML IJ SOLN: 0.6250 mg | Freq: Once | INTRAMUSCULAR | Status: DC | PRN

## 2024-04-22 MED ORDER — IBUPROFEN 600 MG PO TABS: 600.0000 mg | ORAL_TABLET | Freq: Four times a day (QID) | ORAL | 0 refills | Status: AC

## 2024-04-22 MED ORDER — OXYCODONE HCL 5 MG/5ML PO SOLN: 5.0000 mg | Freq: Once | ORAL | Status: AC | PRN

## 2024-04-22 MED ORDER — BUPIVACAINE HCL (PF) 0.5 % IJ SOLN
INTRAMUSCULAR | Status: DC | PRN
  Administered 2024-04-22: 5 mL

## 2024-04-22 MED ORDER — DEXAMETHASONE SODIUM PHOSPHATE 10 MG/ML IJ SOLN
INTRAMUSCULAR | Status: DC | PRN
  Administered 2024-04-22: 10 mg via INTRAVENOUS

## 2024-04-22 SURGICAL SUPPLY — 53 items
APPLICATOR SURGIFLO ENDO (HEMOSTASIS) IMPLANT
BAG URINE DRAIN 2000ML AR STRL (UROLOGICAL SUPPLIES) ×1 IMPLANT
BLADE SURG 15 STRL LF DISP TIS (BLADE) ×1 IMPLANT
CABLE HIGH FREQUENCY MONO STRZ (ELECTRODE) IMPLANT
CATH URTH 16FR FL 2W BLN LF (CATHETERS) ×1 IMPLANT
CHLORAPREP W/TINT 26 (MISCELLANEOUS) IMPLANT
DEFOGGER SCOPE WARM SEASHARP (MISCELLANEOUS) ×1 IMPLANT
DERMABOND ADVANCED .7 DNX12 (GAUZE/BANDAGES/DRESSINGS) ×1 IMPLANT
DEVICE SUTURE ENDOST 10MM (ENDOMECHANICALS) IMPLANT
DRAPE STERI POUCH LG 24X46 STR (DRAPES) ×2 IMPLANT
GAUZE 4X4 16PLY ~~LOC~~+RFID DBL (SPONGE) ×1 IMPLANT
GLOVE BIO SURGEON STRL SZ8 (GLOVE) ×2 IMPLANT
GLOVE INDICATOR 8.0 STRL GRN (GLOVE) ×1 IMPLANT
GOWN STRL REUS W/ TWL LRG LVL3 (GOWN DISPOSABLE) ×2 IMPLANT
GOWN STRL REUS W/ TWL XL LVL3 (GOWN DISPOSABLE) ×1 IMPLANT
GRASPER SUT TROCAR 14GX15 (MISCELLANEOUS) ×1 IMPLANT
IRRIGATION STRYKERFLOW (MISCELLANEOUS) IMPLANT
IV LACTATED RINGERS 1000ML (IV SOLUTION) ×1 IMPLANT
KIT IMAGING PINPOINTPAQ (MISCELLANEOUS) IMPLANT
KIT PINK PAD W/HEAD ARM REST (MISCELLANEOUS) ×1 IMPLANT
LABEL OR SOLS (LABEL) ×1 IMPLANT
LIGASURE VESSEL 5MM BLUNT TIP (ELECTROSURGICAL) IMPLANT
MANIFOLD NEPTUNE II (INSTRUMENTS) ×1 IMPLANT
MANIPULATOR VCARE LG CRV RETR (MISCELLANEOUS) IMPLANT
MANIPULATOR VCARE SML CRV RETR (MISCELLANEOUS) IMPLANT
MANIPULATOR VCARE STD CRV RETR (MISCELLANEOUS) IMPLANT
NDL INSUFF ACCESS 14 VERSASTEP (NEEDLE) ×1 IMPLANT
NDL SPNL 22GX5 LNG QUINC BK (NEEDLE) ×1 IMPLANT
NEEDLE SPNL 22GX5 LNG QUINC BK (NEEDLE) ×1 IMPLANT
NS IRRIG 500ML POUR BTL (IV SOLUTION) ×1 IMPLANT
OCCLUDER COLPOPNEUMO (BALLOONS) ×1 IMPLANT
PACK GYN LAPAROSCOPIC (MISCELLANEOUS) ×1 IMPLANT
PAD OB MATERNITY 11 LF (PERSONAL CARE ITEMS) ×1 IMPLANT
PAD PREP OB/GYN DISP 24X41 (PERSONAL CARE ITEMS) ×1 IMPLANT
POUCH ENDO CATCH II 15MM (MISCELLANEOUS) IMPLANT
SCISSORS METZENBAUM CVD 33 (INSTRUMENTS) IMPLANT
SET CYSTO W/LG BORE CLAMP LF (SET/KITS/TRAYS/PACK) IMPLANT
SPONGE T-LAP 4X18 ~~LOC~~+RFID (SPONGE) IMPLANT
SURGIFLO W/THROMBIN 8M KIT (HEMOSTASIS) IMPLANT
SUT ENDO VLOC 180-0-8IN (SUTURE) IMPLANT
SUT MNCRL AB 4-0 PS2 18 (SUTURE) IMPLANT
SUT VIC AB 0 CT1 36 (SUTURE) ×1 IMPLANT
SUT VICRYL 0 UR6 27IN ABS (SUTURE) IMPLANT
SUTURE MNCRL 4-0 27XMF (SUTURE) ×1 IMPLANT
SYR 10ML LL (SYRINGE) ×1 IMPLANT
SYR 3ML LL SCALE MARK (SYRINGE) IMPLANT
SYR 50ML LL SCALE MARK (SYRINGE) ×2 IMPLANT
TRAP FLUID SMOKE EVACUATOR (MISCELLANEOUS) ×1 IMPLANT
TROCAR BLUNT TIP 12MM OMST12BT (TROCAR) ×1 IMPLANT
TROCAR VERSASTEP PLUS 12MM (TROCAR) ×1 IMPLANT
TROCAR VERSASTEP PLUS 5MM (TROCAR) ×3 IMPLANT
TUBING EVAC SMOKE HEATED PNEUM (TUBING) ×1 IMPLANT
WATER STERILE IRR 500ML POUR (IV SOLUTION) ×1 IMPLANT

## 2024-04-22 NOTE — Anesthesia Preprocedure Evaluation (Addendum)
 Anesthesia Evaluation  Patient identified by MRN, date of birth, ID band Patient awake    Reviewed: Allergy & Precautions, H&P , NPO status , Patient's Chart, lab work & pertinent test results  Airway Mallampati: II  TM Distance: >3 FB Neck ROM: full    Dental  (+) Edentulous Upper   Pulmonary neg pulmonary ROS   Pulmonary exam normal        Cardiovascular hypertension, Normal cardiovascular exam     Neuro/Psych negative neurological ROS  negative psych ROS   GI/Hepatic negative GI ROS, Neg liver ROS,,,  Endo/Other  negative endocrine ROS    Renal/GU      Musculoskeletal   Abdominal Normal abdominal exam  (+)   Peds  Hematology  (+) Blood dyscrasia, anemia   Anesthesia Other Findings Pelvic/ovarian cystic mass Splenomegaly  Past Medical History: No date: Anemia No date: Basal cell carcinoma of neck No date: Cervical cancer (HCC) No date: GERD (gastroesophageal reflux disease) No date: Heart murmur No date: History of gout No date: Hyperlipidemia No date: Hypertension No date: Irritable bowel syndrome with constipation No date: Macular degeneration of both eyes No date: Pre-diabetes No date: Seasonal allergies No date: Sinus headache associated with seasonal allergies No date: Situational depression  Past Surgical History: 11/01/2015: 25 GAUGE PARS PLANA VITRECTOMY WITH 20 GAUGE MVR PORT FOR  MACULAR HOLE; Left     Comment:  Procedure: LEFT EYE 25 GAUGE PARS PLANA VITRECTOMY WITH               20 GAUGE MVR PORT FOR MACULAR HOLE; INSERTION OF C3F8;               HEADSCOPE LASER; SERUM PATCH ;  Surgeon: Norleen JONETTA Ku,              MD;  Location: [facility] OR;  Service: Ophthalmology;                Laterality: Left; No date: BASAL CELL CARCINOMA EXCISION     Comment:  neck No date: COLONOSCOPY     Comment:  x 2 X 1: DILATION AND CURETTAGE OF UTERUS No date: EYE SURGERY 11/01/2015: MEMBRANE PEEL; Left      Comment:  Procedure: MEMBRANE PEEL LEFT EYE;  Surgeon: Norleen JONETTA Ku, MD;  Location: [facility] OR;  Service: Ophthalmology;               Laterality: Left; 11/01/2015: PARS PLANA VITRECTOMY; Left     Comment:  laser, membrane peel, gas fluid exchange, serum patch.               C3F8 injection No date: VAGINAL HYSTERECTOMY     Reproductive/Obstetrics negative OB ROS                              Anesthesia Physical Anesthesia Plan  ASA: 2  Anesthesia Plan: General ETT   Post-op Pain Management: Tylenol  PO (pre-op)* and Celebrex  PO (pre-op)*   Induction: Intravenous  PONV Risk Score and Plan: 2 and Ondansetron  and Dexamethasone   Airway Management Planned: Oral ETT  Additional Equipment:   Intra-op Plan:   Post-operative Plan: Extubation in OR  Informed Consent: I have reviewed the patients History and Physical, chart, labs and discussed the procedure including the risks, benefits and alternatives for the proposed anesthesia with the patient or authorized representative who has indicated  his/her understanding and acceptance.     Dental Advisory Given  Plan Discussed with: CRNA and Surgeon  Anesthesia Plan Comments:          Anesthesia Quick Evaluation

## 2024-04-22 NOTE — Transfer of Care (Signed)
 Immediate Anesthesia Transfer of Care Note  Patient: Julia Noble  Procedure(s) Performed: SALPINGO-OOPHORECTOMY, BILATERAL, LAPAROSCOPIC (Bilateral) LAPAROSCOPIC TOTAL PELVIC LYMPHADENECTOMY (Bilateral)  Patient Location: PACU  Anesthesia Type:General  Level of Consciousness: awake and patient cooperative  Airway & Oxygen Therapy: Patient Spontanous Breathing  Post-op Assessment: Report given to RN and Post -op Vital signs reviewed and stable  Post vital signs: stable  Last Vitals:  Vitals Value Taken Time  BP 112/64 04/22/24 14:32  Temp    Pulse 71 04/22/24 14:35  Resp 29 04/22/24 14:35  SpO2 100 % 04/22/24 14:35  Vitals shown include unfiled device data.  Last Pain:  Vitals:   04/22/24 1124  TempSrc: Tympanic  PainSc: 0-No pain         Complications: No notable events documented.

## 2024-04-22 NOTE — Op Note (Signed)
  Operative Note   04/22/2024 4:01 PM  PRE-OP DIAGNOSIS: Pelvic/ovarian cystic mass    POST-OP DIAGNOSIS: same  SURGEON: Surgeons and Role:    DEWAINE Mancil Barter, MD - Primary    * Leonce Garnette BIRCH, MD  ANESTHESIA: General   PROCEDURE: SALPINGO-OOPHORECTOMY, BILATERAL, LAPAROSCOPIC  ESTIMATED BLOOD LOSS: Minimal  DRAINS: no  TOTAL IV FLUIDS: per anesth  SPECIMENS: bilateral tubes and ovaries, peritoneal fluid.  COMPLICATIONS: none  DISPOSITION: PACU - hemodynamically stable.  CONDITION: stable  INDICATIONS: Small right ovarian cyst and pelvic pain  FINDINGS: Normal upper abdominal survey.  Right ovary with small cyst.  Left ovary normal. Both tubes normal.  There were some filmy adhesions to the omentum in the pelvis.  Inspection of the ovaries when they were removed was reassuring.  The small cyst on the right was opened and did not have any excrescences.    PROCEDURE IN DETAIL: After informed consent was obtained, the patient was taken to the operating room where anesthesia was obtained without difficulty. The patient was positioned in the dorsal lithotomy position in Hanover stirrups and her arms were carefully tucked at her sides and the usual precautions were taken.  She was prepped and draped in normal sterile fashion.  Time-out was performed and a Foley catheter was placed into the bladder.  An open Hasson technique was used to place an infraumbilical 10-mm baloon trocar under direct visualization. The laparoscope was introduced and CO2 gas was infused for pneumoperitoneum to a pressure of 15 mm Hg.  Right and left lateral 5-mm ports were placed under direct visualization of the laparoscope using an EndoStep technique.  Small amount of peritoneal fluid aspirated and sent for cytology.  The patient was placed in Trendelenburg and the bowel was displaced up into the upper abdomen.  Filmy adhesions of the omentum in the pelvis were taken down with the ligasure.  The  retroperitoneal space was opened bilaterally.  The ureters were identified and preserved.  The infundibulopelvic ligaments were skeletonized, sealed and divided with the LigaSure device.  Attachments to the round ligament stumps were transected.  The ovaries were placed in an endocatch bag and removed via the umbilical port site.    Hemostasis was observed. The intraperitoneal pressure was dropped, and all planes of dissection and vascular pedicles were found to be hemostatic.  The lateral trocars were removed under visualization.   Before the umbilical trocar was removed the CO2 gas was released.  The fascia there was closed with 0 Vicryl suture in interrupted technique.   The skin incisions were closed with subcuticular stitches and glue.  The patient tolerated the procedure well.  Sponge, lap and needle counts were correct x2.  The patient was taken to recovery room in excellent condition.  Antibiotics: not indicated   VTE prophylaxis: was ordered perioperatively.  Barter Mancil, MD

## 2024-04-22 NOTE — Interval H&P Note (Signed)
 History and Physical Interval Note:  04/22/2024 12:36 PM  Julia Noble  has presented today for surgery, with the diagnosis of Pelvic/ovarian cystic mass.  The various methods of treatment have been discussed with the patient and family. After consideration of risks, benefits and other options for treatment, the patient has consented to  Procedure(s) with comments: SALPINGO-OOPHORECTOMY, BILATERAL, LAPAROSCOPIC (Bilateral) LAPAROSCOPIC TOTAL PELVIC LYMPHADENECTOMY (Bilateral) - Possible staging with pelvic/aortic node sampling, omentectomy, laparotomy as a surgical intervention.  The patient's history has been reviewed, patient examined, no change in status, stable for surgery.  I have reviewed the patient's chart and labs.  Questions were answered to the patient's satisfaction.     Prentice Agent

## 2024-04-23 ENCOUNTER — Encounter: Payer: Self-pay | Admitting: Obstetrics and Gynecology

## 2024-04-23 LAB — CYTOLOGY - NON PAP

## 2024-04-23 NOTE — Anesthesia Postprocedure Evaluation (Signed)
 Anesthesia Post Note  Patient: Julia Noble  Procedure(s) Performed: SALPINGO-OOPHORECTOMY, BILATERAL, LAPAROSCOPIC (Bilateral) LAPAROSCOPIC TOTAL PELVIC LYMPHADENECTOMY (Bilateral)  Patient location during evaluation: PACU Anesthesia Type: General Level of consciousness: awake and alert Pain management: pain level controlled Vital Signs Assessment: post-procedure vital signs reviewed and stable Respiratory status: spontaneous breathing, nonlabored ventilation and respiratory function stable Cardiovascular status: blood pressure returned to baseline and stable Postop Assessment: no apparent nausea or vomiting Anesthetic complications: no   There were no known notable events for this encounter.   Last Vitals:  Vitals:   04/22/24 1500 04/22/24 1526  BP: 130/68 (!) 110/50  Pulse: 71 77  Resp: 14 14  Temp: 36.6 C 36.6 C  SpO2: 99% 100%    Last Pain:  Vitals:   04/22/24 1526  TempSrc: Temporal  PainSc: 2                  Camellia Merilee Louder

## 2024-04-24 LAB — SURGICAL PATHOLOGY

## 2024-05-27 ENCOUNTER — Inpatient Hospital Stay: Payer: Medicare (Managed Care) | Attending: Obstetrics and Gynecology | Admitting: Obstetrics and Gynecology

## 2024-05-27 VITALS — BP 112/72 | HR 78 | Temp 98.6°F | Resp 19 | Wt 129.8 lb

## 2024-05-27 DIAGNOSIS — Z803 Family history of malignant neoplasm of breast: Secondary | ICD-10-CM | POA: Insufficient documentation

## 2024-05-27 DIAGNOSIS — D279 Benign neoplasm of unspecified ovary: Secondary | ICD-10-CM

## 2024-05-27 DIAGNOSIS — D649 Anemia, unspecified: Secondary | ICD-10-CM | POA: Insufficient documentation

## 2024-05-27 DIAGNOSIS — Z79899 Other long term (current) drug therapy: Secondary | ICD-10-CM | POA: Insufficient documentation

## 2024-05-27 DIAGNOSIS — N83201 Unspecified ovarian cyst, right side: Secondary | ICD-10-CM | POA: Insufficient documentation

## 2024-05-27 DIAGNOSIS — R161 Splenomegaly, not elsewhere classified: Secondary | ICD-10-CM | POA: Insufficient documentation

## 2024-05-27 NOTE — Progress Notes (Signed)
 Gynecologic Oncology Interval Visit   Referring Provider: Dr. Lovetta  Chief Concern: postoperative visit  Subjective:  Julia Noble is a 73 y.o. female who is seen in consultation from Dr. Cleotilde for small right adnexal mass.  04/22/2024 L/S BSO  1. Ovary and fallopian tube, left,  :       - BENIGN SEROUS CYSTADENOFIBROMA       - BENIGN UNREMARKABLE SEGMENT OF FALLOPIAN TUBE       - NO EVIDENCE OF MALIGNANCY        2. Ovary and fallopian tube, right,  :       - BENIGN SEROUS CYSTADENOFIBROMA       - BENIGN UNREMARKABLE SEGMENT OF FALLOPIAN TUBE       - NO EVIDENCE OF MALIGNANCY   [place] - NEGATIVE FOR MALIGNANCY   Gynecologic Oncology  Julia Noble is a pleasant female who is seen in consultation from Dr. Cleotilde for small right adnexal mass.  Pelvic pain and scans done that showed small cystic mass in right adnexa.  11/21/23 CT scan FINDINGS: Lower chest: No acute abnormality.   Hepatobiliary: No focal liver abnormality is seen. No gallstones, gallbladder wall thickening, or biliary dilatation.   Pancreas: Unremarkable. No pancreatic ductal dilatation or surrounding inflammatory changes.   Spleen: Enlarged measuring 12.9 by 13.3 x 6 cm in the longitudinal, AP and transverse diameterVolume of 1029 g   Adrenals/Urinary Tract: Adrenal glands are unremarkable. Kidneys are normal, without renal calculi, focal lesion, or hydronephrosis. Bladder is unremarkable. 1.8 cm cyst of the lower pole right kidney.   Bosniak 1, No follow-up imaging is recommended.   JACR 2018 Feb; 264-273, Management of the Incidental Renal Mass on CT, [employer] 2021; [phone], Bosniak Classification of Cystic Renal Masses, Version 2019.   Stomach/Bowel: Stomach is within normal limits. Appendix appears normal. No evidence of bowel wall thickening, distention, or inflammatory changes. Cecal appendix normal moderate amount of residual fecal material left side of the colon  without diverticulitis   Vascular/Lymphatic:   Reproductive: Prior hysterectomy   Within the right side of the pelvis soft tissue nodular density measuring 2.3 x 1.9 cm could correlate with the right ovary. It has some calcifications along the lateral margin and 1 nodular calcification along the medial margin., calcifications are usually benign of no clinical significance and could be related to prior hemorrhagic cyst, could be related to endometriosis are other inflammatory changes.   Other: No abdominal wall hernia or abnormality. No abdominopelvic ascites. Grade 1 posterior listhesis of L5 on L4.   Musculoskeletal:   IMPRESSION: *Splenomegaly. *Within the right side of the pelvis soft tissue nodular density measuring 2.3 x 1.9 cm could correlate with the right ovary. It has some calcifications along the lateral margin and 1 nodular calcification along the medial margin. Calcifications are usually benign of no clinical significance and could be related to prior hemorrhagic cyst, could be related to endometriosis are other inflammatory changes. *Grade 1 posterior listhesis  11/27/23 MRI FINDINGS: Lower Urinary Tract: No urinary bladder or urethral abnormality identified.   Bowel: Mild sigmoid diverticulosis, without signs of diverticulitis.   Vascular/Lymphatic: Unremarkable. No pathologically enlarged pelvic lymph nodes identified.   Reproductive:   -- Uterus: Prior hysterectomy. Vaginal cuff is unremarkable in appearance.   -- Right ovary: A small complex cystic lesion is seen with several thin internal septations, measuring 3.5 x 2.6 x 1.9 cm. No thickened or nodular septations, enhancing soft tissue component, or internal fat identified. A portion of  this lesion has a T2 dark peripheral rim, suggesting hemosiderin. This has characteristics of an O-RADS MRI 3 lesion.   -- Left ovary: Appears normal. No ovarian or adnexal masses identified.   Other: No  peritoneal thickening or abnormal free fluid.   Musculoskeletal:  Unremarkable.   IMPRESSION: 3.5 cm complex cystic lesion in the right ovary, with characteristics consistent with O-RADS MRI category 3 lesion. (Low risk; PPV for malignancy ~5%). Consider continued imaging follow-up by MRI in 6 months.   Prior vaginal hysterectomy for cervical cancer.   Mild sigmoid diverticulosis, without radiographic evidence of diverticulitis.  She saw Dr Lovetta 12/11/23 The patient is a 73 year old female with a history of cervical cancer who presents with a h/o left pelvic pain.seen by Dr Cleotilde who performed scans.  She has been experiencing left pelvic pain for several months, described as tender. A CT scan and MRI were performed, revealing a slightly enlarged spleen. She is uncertain about the evaluation plan for the spleen.  She underwent a partial hysterectomy in 1983 due to cervical cancer( reported not spread outside the cx ), with the uterus removed but the ovaries retained. She has had seven pregnancies, resulting in five children, with two miscarriages, all delivered vaginally.  She reports intentional weight loss of approximately 20 pounds, along with abdominal bloating for about a year and early satiety for six to eight months. No bleeding or problems with bowel movements. She believes her last colonoscopy was about ten years ago.  In the family history, her paternal grandmother had cervical cancer and other cancers, and her mother had a small cancerous mass on her left breast, which was successfully removed.  12/11/23 Normal CA125 12.8, HE4 93.7 ROMA calculated 26%  12/23/23 Seem by Dr Babara for mild splenomegaly.   Discussed with patient that differential diagnosis for splenomegaly is very broad, including infection, autoimmune disease, infiltrative disease [amyloidosis, sarcoidosis, storage disease], liver disease, myeloproliferative or lymphoproliferative disease. Labs reviewed and  discussed with patient.  Normal LDH, negative HIV, negative hepatitis, negative JAK2 mutation with reflex, negative BCR-ABL 1 FISH, no M protein on protein electrophoresis, negative ANA, normal ESR, negative EBV, negative peripheral blood flow cytometry. CRP is elevated indicating underlying chronic inflammation. For now patient is asymptomatic.  I recommend observation.  12/26/23 TVUS showed 4 cm multilocular cyst in right ovary Endovaginal Imaging ================ Uterus ====== Surgically Absent  Right Ovary ========= Visualized, Enlarged. Size 42 mm x 29 mm x 19 mm Cyst(s)    Size 41.0 mm x 20.0 mm x 35 mm. Mean 32.0 mm. Vol 15.027 cm. Multilocular with anechoic cystic components. Posterior acoustic       shadowing is noted. No vascularity on color Doppler.  Right Tube ========= Not visualized  Left Ovary ======== Visualized, Normal. Outline: smooth. Morphology: appropriate. Size 16 mm x 8 mm x 8 mm No cysts identified  Left Tube ======== Not visualized  Cul de Sac ========= Normal  Impression ========= The uterus is surgically absent. Vaginal/ Cervical cuff appears grossly normal. Right ovarian lesion is noted. Please see details above. Left ovary appears within normal limits. There are no unusual adnexal findings.   03/30/24 TVUS Endovaginal Imaging  Uterus ====== Not examined Surgically Absent  Right Ovary ========= Visualized, Enlarged. Outline: irregular. Size 36 mm x 25 mm x 15 mm Cyst(s)    Multilocular with anechoic cystic components; posterior shadowing present. Size 29.0 mm x 15.0 mm x 21 mm. Mean 21.7 mm. Vol 4.783  cm  Left Ovary ======== Visualized,  Enlarged. Size 31 mm x 17 mm x 21 mm No cysts identified Follicle(s)    Size 12.0 mm x 6.0 mm x 8.0 mm. Mean 8.7 mm. Vol 0.302 cm Heterogeneous ovarian tissue  Cul de Sac ========= Visualized. Mixed echogenicity. Free fluid visualized: mild Largest pool 20.0 mm x 11.0 mm x 23.0 mm. Vol  2.649 ml Free fluid seen in left and right PCDS'S  Impression ========= The uterus is surgically absent. Vaginal cuff appears grossly normal.  The ovaries appear enlarged; Right ovary contains a multilocular cyst described and measured in detail above; Left ovary contains a follicle measuring 12mm with heterogeneous ovarian tissue.  Free fluid seen in left and right posterior cul de sacs with measurements above.   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    Past Medical History: Past Medical History:  Diagnosis Date   Anemia    Basal cell carcinoma of neck    Cervical cancer (HCC)    GERD (gastroesophageal reflux disease)    Heart murmur    History of gout    Hyperlipidemia    Hypertension    Irritable bowel syndrome with constipation    Macular degeneration of both eyes    Pre-diabetes    Seasonal allergies    Sinus headache associated with seasonal allergies    Situational depression     Past Surgical History: Past Surgical History:  Procedure Laterality Date   25 GAUGE PARS PLANA VITRECTOMY WITH 20 GAUGE MVR PORT FOR MACULAR HOLE Left 11/01/2015   Procedure: LEFT EYE 25 GAUGE PARS PLANA VITRECTOMY WITH 20 GAUGE MVR PORT FOR MACULAR HOLE; INSERTION OF C3F8; HEADSCOPE LASER; SERUM PATCH ;  Surgeon: Norleen JONETTA Ku, MD;  Location: [facility] OR;  Service: Ophthalmology;  Laterality: Left;   BASAL CELL CARCINOMA EXCISION     neck   COLONOSCOPY     x 2    DILATION AND CURETTAGE OF UTERUS  X 1   EYE SURGERY     LAPAROSCOPIC BILATERAL SALPINGO OOPHERECTOMY Bilateral 04/22/2024   Procedure: SALPINGO-OOPHORECTOMY, BILATERAL, LAPAROSCOPIC;  Surgeon: Mancil Barter, MD;  Location: [facility] ORS;  Service: Gynecology;  Laterality: Bilateral;   MEMBRANE PEEL Left 11/01/2015   Procedure: MEMBRANE PEEL LEFT EYE;  Surgeon: Norleen JONETTA Ku, MD;  Location: [facility] OR;  Service: Ophthalmology;  Laterality: Left;   PARS PLANA VITRECTOMY Left 11/01/2015   laser, membrane peel, gas fluid exchange, serum patch. C3F8 injection   VAGINAL HYSTERECTOMY       OB History:  G7P5   Family History: Family History  Problem Relation Age of Onset   Breast cancer Mother 17   Hypertension Mother    Cancer Father        ? cancerous polyp   COPD Father    Cancer Paternal Grandmother     Social History: Social History   Socioeconomic History   Marital status: Widowed    Spouse name: Not on file   Number of children: Not on file   Years of education: Not on  file   Highest education level: Not on file  Occupational History   Not on file  Tobacco Use   Smoking status: Never   Smokeless tobacco: Never  Substance and Sexual Activity   Alcohol use: No   Drug use: No   Sexual activity: Not Currently    Birth control/protection: None  Other Topics Concern   Not on file  Social History Narrative   ** Merged History Encounter **       Social Drivers of Health   Financial Resource Strain: Low Risk  (12/11/2023)   Received from [facility]   Overall Financial Resource Strain (CARDIA)    Difficulty of Paying Living Expenses: Not hard at all  Food Insecurity: No Food Insecurity (12/11/2023)   Received from [facility]   Hunger Vital Sign    Within the past 12 months, you worried that your food would run out before you got the money to buy more.: Never true    Within the past 12 months, the food you bought just didn't last and you  didn't have money to get more.: Never true  Transportation Needs: No Transportation Needs (12/11/2023)   Received from [facility] - Transportation    In the past 12 months, has lack of transportation kept you from medical appointments or from getting medications?: No    Lack of Transportation (Non-Medical): No  Physical Activity: Not on file  Stress: Not on file  Social Connections: Not on file  Intimate Partner Violence: Not At Risk (12/05/2023)   Humiliation, Afraid, Rape, and Kick questionnaire    Fear of Current or Ex-Partner: No    Emotionally Abused: No    Physically Abused: No    Sexually Abused: No    Allergies: No Known Allergies  Current Medications: Current Outpatient Medications  Medication Sig Dispense Refill   allopurinol (ZYLOPRIM) 300 MG tablet Take 300 mg by mouth daily.     azelastine (ASTELIN) 0.1 % nasal spray Place 1 spray into both nostrils daily as needed for rhinitis. Use in each nostril as directed     Cyanocobalamin (VITAMIN B-12 PO) Take 30 mcg by mouth daily. With iron 400 mg Blood builder     HYDROcodone -acetaminophen  (NORCO/VICODIN) 5-325 MG tablet Take 1 tablet by mouth every 4 (four) hours as needed. 20 tablet 0   ibuprofen  (ADVIL ) 600 MG tablet Take 1 tablet (600 mg total) by mouth every 6 (six) hours. 30 tablet 0   lisinopril  (PRINIVIL ,ZESTRIL ) 20 MG tablet Take 20 mg by mouth daily.     pantoprazole  (PROTONIX ) 40 MG tablet Take 40 mg by mouth daily.     sertraline  (ZOLOFT ) 100 MG tablet Take 100 mg by mouth every morning.     simvastatin  (ZOCOR ) 20 MG tablet Take 20 mg by mouth at bedtime.     traZODone (DESYREL) 50 MG tablet Take 25 mg by mouth at bedtime.     triamterene -hydrochlorothiazide  (MAXZIDE -25) 37.5-25 MG tablet Take 1 tablet by mouth daily.     No current facility-administered medications for this visit.    Review of Systems General: no complaints  HEENT: no complaints  Lungs: no complaints  Cardiac:  no complaints  GI: no complaints  GU: no complaints  Musculoskeletal: no complaints  Extremities: no complaints  Skin: no complaints  Neuro: no complaints  Endocrine: no complaints  Psych: no complaints       Objective:  Physical Examination:  BP 112/72   Pulse  78   Temp 98.6 F (37 C)   Resp 19   Wt 129 lb 12.8 oz (58.9 kg)   SpO2 99%   BMI 24.53 kg/m    ECOG Performance Status: 1 - Symptomatic but completely ambulatory  GENERAL: Patient is a well appearing female in no acute distress HEENT:  Atraumatic and normocephalic. PERRL, neck supple. LUNGS:  Normal respiratory effort ABDOMEN:  Soft, nontender. Nondistended. No masses/ascites/hernia/or hepatomegaly. Incisions well healed.  EXTREMITIES:  No peripheral edema.   NEURO:  Nonfocal. Well oriented.  Appropriate affect.    Lab Review Labs on site today: Lab Results  Component Value Date   WBC 5.9 04/17/2024   HGB 11.2 (L) 04/17/2024   HCT 33.9 (L) 04/17/2024   MCV 84.1 04/17/2024   PLT 160 04/17/2024     Chemistry      Component Value Date/Time   NA 139 04/17/2024 0830   K 3.7 04/17/2024 0830   CL 100 04/17/2024 0830   CO2 28 04/17/2024 0830   BUN 18 04/17/2024 0830   CREATININE 0.97 04/17/2024 0830      Component Value Date/Time   CALCIUM 9.3 04/17/2024 0830   ALKPHOS 87 04/17/2024 0830   AST 22 04/17/2024 0830   ALT 13 04/17/2024 0830   BILITOT 0.5 04/17/2024 0830         Assessment:  Julia Noble is a 73 y.o. female with pelvic pain diagnosed with right ovarian cyst/growth 2.5 cm with calcification on CT scan in 4/25.  ORADS 3 on MRI complex cystic lesion with several thin internal septations, measuring 3.5 x 2.6 x 1.9 cm. No thickened or nodular septations, enhancing soft tissue component, or internal fat identified. A portion of this lesion has a T2 dark peripheral rim, suggesting hemosiderin. This has characteristics of an O-RADS MRI 3 lesion 5% risk of cancer.  Left adnexa normal.  CA125 and  HE4 normal, but ROMA 26%. S/p L/S BSO with benign pathology   Anemia   Prior vaginal hysterectomy for cervical cancer 1983.   Medical co-morbidities complicating care: mild splenomegaly with negative work up recently. Normal blood counts, except mild anemia.  Plan:   Problem List Items Addressed This Visit   None Visit Diagnoses       Cystadenofibroma of ovary, unspecified laterality    -  Primary      We reviewed her pathology results which are benign.   Follow up of her anemia with PCP.   She will be released from [facility] clinic.   The patient's diagnosis, an outline of the further diagnostic and laboratory studies which will be required, the recommendation, and alternatives were discussed.  All questions were answered to the patient's satisfaction.   Hermine Feria Isidor Constable,    [phone]

## 2024-06-23 ENCOUNTER — Inpatient Hospital Stay: Payer: Medicare (Managed Care) | Admitting: Oncology

## 2024-06-23 ENCOUNTER — Inpatient Hospital Stay: Payer: Medicare (Managed Care)

## 2024-06-23 ENCOUNTER — Encounter: Payer: Self-pay | Admitting: Oncology

## 2024-06-23 VITALS — BP 124/84 | HR 84 | Temp 97.8°F | Resp 20 | Wt 129.9 lb

## 2024-06-23 DIAGNOSIS — D649 Anemia, unspecified: Secondary | ICD-10-CM

## 2024-06-23 DIAGNOSIS — Z79899 Other long term (current) drug therapy: Secondary | ICD-10-CM | POA: Diagnosis not present

## 2024-06-23 DIAGNOSIS — R161 Splenomegaly, not elsewhere classified: Secondary | ICD-10-CM

## 2024-06-23 DIAGNOSIS — N83201 Unspecified ovarian cyst, right side: Secondary | ICD-10-CM | POA: Diagnosis not present

## 2024-06-23 DIAGNOSIS — Z803 Family history of malignant neoplasm of breast: Secondary | ICD-10-CM | POA: Diagnosis not present

## 2024-06-23 LAB — CBC WITH DIFFERENTIAL (CANCER CENTER ONLY)
Abs Immature Granulocytes: 0.03 K/uL (ref 0.00–0.07)
Basophils Absolute: 0 K/uL (ref 0.0–0.1)
Basophils Relative: 0 %
Eosinophils Absolute: 0.1 K/uL (ref 0.0–0.5)
Eosinophils Relative: 2 %
HCT: 35 % — ABNORMAL LOW (ref 36.0–46.0)
Hemoglobin: 11.9 g/dL — ABNORMAL LOW (ref 12.0–15.0)
Immature Granulocytes: 0 %
Lymphocytes Relative: 21 %
Lymphs Abs: 1.4 K/uL (ref 0.7–4.0)
MCH: 28.1 pg (ref 26.0–34.0)
MCHC: 34 g/dL (ref 30.0–36.0)
MCV: 82.7 fL (ref 80.0–100.0)
Monocytes Absolute: 0.3 K/uL (ref 0.1–1.0)
Monocytes Relative: 5 %
Neutro Abs: 4.8 K/uL (ref 1.7–7.7)
Neutrophils Relative %: 72 %
Platelet Count: 170 K/uL (ref 150–400)
RBC: 4.23 MIL/uL (ref 3.87–5.11)
RDW: 13.5 % (ref 11.5–15.5)
WBC Count: 6.7 K/uL (ref 4.0–10.5)
nRBC: 0 % (ref 0.0–0.2)

## 2024-06-23 LAB — FOLATE: Folate: >20 ng/mL (ref >5.9)

## 2024-06-23 LAB — VITAMIN B12: Vitamin B-12: 620 pg/mL (ref 180–914)

## 2024-06-23 LAB — RETIC PANEL
Immature Retic Fract: 6.6 % (ref 2.3–15.9)
RBC.: 4.25 MIL/uL (ref 3.87–5.11)
Retic Count, Absolute: 59.1 K/uL (ref 19.0–186.0)
Retic Ct Pct: 1.4 % (ref 0.4–3.1)
Reticulocyte Hemoglobin: 32.4 pg (ref >27.9)

## 2024-06-23 NOTE — Assessment & Plan Note (Addendum)
 Imaging results reviewed and discussed with patient.  She has mild splenomegaly. Discussed with patient that differential diagnosis for splenomegaly is very broad, including infection, autoimmune disease, infiltrative disease [amyloidosis, sarcoidosis, storage disease], liver disease, myeloproliferative or lymphoproliferative disease. Labs reviewed and discussed with patient.  Normal LDH, negative HIV, negative hepatitis, negative JAK2 mutation with reflex, negative BCR-ABL 1 FISH, no M protein on protein electrophoresis, negative ANA, normal ESR, negative EBV, negative peripheral blood flow cytometry.  CRP is elevated indicating underlying chronic inflammation. For now patient is asymptomatic and doing well clinically  I recommend observation.

## 2024-06-23 NOTE — Progress Notes (Signed)
 Hematology/Oncology Progress note [phone] [phone]           REFERRING PROVIDER: Cleotilde Oneil FALCON, MD   CHIEF COMPLAINTS/REASON FOR VISIT:  Anemia splenomegaly   ASSESSMENT & PLAN:   Splenomegaly Imaging results reviewed and discussed with patient.  She has mild splenomegaly. Discussed with patient that differential diagnosis for splenomegaly is very broad, including infection, autoimmune disease, infiltrative disease [amyloidosis, sarcoidosis, storage disease], liver disease, myeloproliferative or lymphoproliferative disease. Labs reviewed and discussed with patient.  Normal LDH, negative HIV, negative hepatitis, negative JAK2 mutation with reflex, negative BCR-ABL 1 FISH, no M protein on protein electrophoresis, negative ANA, normal ESR, negative EBV, negative peripheral blood flow cytometry.  CRP is elevated indicating underlying chronic inflammation. For now patient is asymptomatic and doing well clinically  I recommend observation.  No orders of the defined types were placed in this encounter.  Follow-up PRN All questions were answered. The patient knows to call the clinic with any problems, questions or concerns.  Zelphia Cap, MD, PhD [facility] Hematology Oncology 06/23/2024   HISTORY OF PRESENTING ILLNESS:   Julia Noble is a  73 y.o.  female with PMH listed below was seen in consultation at the request of  Cleotilde Oneil FALCON, MD  for evaluation of splenomegaly  A slightly enlarged spleen was incidentally found on a CT scan performed on November 21, 2023, initially ordered due to occasional tenderness on the left side of her abdomen. This tenderness has been present for a while but was often overlooked. Low hemoglobin levels were noted, prompting further investigation.  She has a history of mild anemia, with hemoglobin levels noted to be 11.5 in June 2024 and slightly lower in recent tests. She experiences occasional night sweats, less frequently than  before, and attributes some hot flashes to menopause. There is no unintentional weight loss, but she reports a decrease in appetite following intentional weight loss of 20 pounds.  The CT scan also revealed an ovarian cyst on the right side, assessed as low risk following  MRI pelvis  She has an upcoming appointment with a gynecologist to discuss this finding further.  She experiences joint pain primarily in her hands, attributed to arthritis and gout. There is swelling in her thumb but no significant pain. She engages in activities like crocheting, which may contribute to joint discomfort.  INTERVAL HISTORY SHARIN Noble is a 72 y.o. female who has above history reviewed by me today presents for follow up visit for  splenomegaly.  Denies weight loss, fever, chills, fatigue, night sweats.    MEDICAL HISTORY:  Past Medical History:  Diagnosis Date   Anemia    Basal cell carcinoma of neck    Cervical cancer (HCC)    GERD (gastroesophageal reflux disease)    Heart murmur    History of gout    Hyperlipidemia    Hypertension    Irritable bowel syndrome with constipation    Macular degeneration of both eyes    Pre-diabetes    Seasonal allergies    Sinus headache associated with seasonal allergies    Situational depression     SURGICAL HISTORY: Past Surgical History:  Procedure Laterality Date   25 GAUGE PARS PLANA VITRECTOMY WITH 20 GAUGE MVR PORT FOR MACULAR HOLE Left 11/01/2015   Procedure: LEFT EYE 25 GAUGE PARS PLANA VITRECTOMY WITH 20 GAUGE MVR PORT FOR MACULAR HOLE; INSERTION OF C3F8; HEADSCOPE LASER; SERUM PATCH ;  Surgeon: Norleen JONETTA Ku, MD;  Location: [facility] OR;  Service: Ophthalmology;  Laterality: Left;   BASAL CELL CARCINOMA EXCISION     neck   COLONOSCOPY     x 2   DILATION AND CURETTAGE OF UTERUS  X 1   EYE SURGERY     LAPAROSCOPIC BILATERAL SALPINGO OOPHERECTOMY Bilateral 04/22/2024   Procedure: SALPINGO-OOPHORECTOMY, BILATERAL, LAPAROSCOPIC;  Surgeon: Mancil Barter,  MD;  Location: [facility] ORS;  Service: Gynecology;  Laterality: Bilateral;   MEMBRANE PEEL Left 11/01/2015   Procedure: MEMBRANE PEEL LEFT EYE;  Surgeon: Norleen JONETTA Ku, MD;  Location: [facility] OR;  Service: Ophthalmology;  Laterality: Left;   PARS PLANA VITRECTOMY Left 11/01/2015   laser, membrane peel, gas fluid exchange, serum patch. C3F8 injection   VAGINAL HYSTERECTOMY      SOCIAL HISTORY: Social History   Socioeconomic History   Marital status: Widowed    Spouse name: Not on file   Number of children: Not on file   Years of education: Not on file   Highest education level: Not on file  Occupational History   Not on file  Tobacco Use   Smoking status: Never   Smokeless tobacco: Never  Substance and Sexual Activity   Alcohol use: No   Drug use: No   Sexual activity: Not Currently    Birth control/protection: None  Other Topics Concern   Not on file  Social History Narrative   ** Merged History Encounter **       Social Drivers of Health   Financial Resource Strain: Low Risk  (12/11/2023)   Received from [facility]   Overall Financial Resource Strain (CARDIA)    Difficulty of Paying Living Expenses: Not hard at all  Food Insecurity: No Food Insecurity (12/11/2023)   Received from [facility]   Hunger Vital Sign    Within the past 12 months, you worried that your food would run out before you got the money to buy more.: Never true    Within the past 12 months, the food you bought just didn't last and you didn't have money to get more.: Never true  Transportation Needs: No Transportation Needs (12/11/2023)   Received from [facility] - Transportation    In the past 12 months, has lack of transportation kept you from medical appointments or from getting medications?: No    Lack of Transportation (Non-Medical): No  Physical Activity: Not on file  Stress: Not on file  Social Connections: Not on file  Intimate Partner  Violence: Not At Risk (12/05/2023)   Humiliation, Afraid, Rape, and Kick questionnaire    Fear of Current or Ex-Partner: No    Emotionally Abused: No    Physically Abused: No    Sexually Abused: No    FAMILY HISTORY: Family History  Problem Relation Age of Onset   Breast cancer Mother 65   Hypertension Mother    Cancer Father        ? cancerous polyp   COPD Father    Cancer Paternal Grandmother     ALLERGIES:  has no known allergies.  MEDICATIONS:  Current Outpatient Medications  Medication Sig Dispense Refill   allopurinol (ZYLOPRIM) 300 MG tablet Take 300 mg by mouth daily.     azelastine (ASTELIN) 0.1 % nasal spray Place 1 spray into both nostrils daily as needed for rhinitis. Use in each nostril as directed     Cyanocobalamin (VITAMIN B-12 PO) Take 30 mcg by mouth daily. With iron 400 mg Blood builder  HYDROcodone -acetaminophen  (NORCO/VICODIN) 5-325 MG tablet Take 1 tablet by mouth every 4 (four) hours as needed. 20 tablet 0   ibuprofen  (ADVIL ) 600 MG tablet Take 1 tablet (600 mg total) by mouth every 6 (six) hours. 30 tablet 0   lisinopril  (PRINIVIL ,ZESTRIL ) 20 MG tablet Take 20 mg by mouth daily.     pantoprazole  (PROTONIX ) 40 MG tablet Take 40 mg by mouth daily.     sertraline  (ZOLOFT ) 100 MG tablet Take 100 mg by mouth every morning.     simvastatin  (ZOCOR ) 20 MG tablet Take 20 mg by mouth at bedtime.     traZODone (DESYREL) 50 MG tablet Take 25 mg by mouth at bedtime.     triamterene -hydrochlorothiazide  (MAXZIDE -25) 37.5-25 MG tablet Take 1 tablet by mouth daily.     No current facility-administered medications for this visit.    Review of Systems  Constitutional:  Negative for appetite change, chills, fatigue and fever.  HENT:   Negative for hearing loss and voice change.   Eyes:  Negative for eye problems.  Respiratory:  Negative for chest tightness and cough.   Cardiovascular:  Negative for chest pain.  Gastrointestinal:  Negative for abdominal distention,  abdominal pain and blood in stool.       Intermittent left lower quadrant pain  Endocrine: Negative for hot flashes.  Genitourinary:  Negative for difficulty urinating and frequency.   Musculoskeletal:  Negative for arthralgias.  Skin:  Negative for itching and rash.  Neurological:  Negative for extremity weakness.  Hematological:  Negative for adenopathy.  Psychiatric/Behavioral:  Negative for confusion.    PHYSICAL EXAMINATION: ECOG PERFORMANCE STATUS: 0 - Asymptomatic Vitals:   06/23/24 1345  BP: 124/84  Pulse: 84  Resp: 20  Temp: 97.8 F (36.6 C)  SpO2: 100%   Filed Weights   06/23/24 1345  Weight: 129 lb 14.4 oz (58.9 kg)    Physical Exam Constitutional:      General: She is not in acute distress. HENT:     Head: Normocephalic and atraumatic.  Eyes:     General: No scleral icterus. Cardiovascular:     Rate and Rhythm: Normal rate and regular rhythm.  Pulmonary:     Effort: Pulmonary effort is normal. No respiratory distress.     Breath sounds: No wheezing.  Abdominal:     General: Bowel sounds are normal. There is no distension.     Palpations: Abdomen is soft.     Comments: Palpable liver edge  Musculoskeletal:        General: No deformity. Normal range of motion.     Cervical back: Normal range of motion and neck supple.  Skin:    General: Skin is warm and dry.     Findings: No erythema or rash.  Neurological:     Mental Status: She is alert and oriented to person, place, and time. Mental status is at baseline.  Psychiatric:        Mood and Affect: Mood normal.     LABORATORY DATA:  I have reviewed the data as listed    Latest Ref Rng & Units 06/23/2024   12:50 PM 04/17/2024    8:30 AM 12/05/2023    3:52 PM  CBC  WBC 4.0 - 10.5 K/uL 6.7  5.9  7.5   Hemoglobin 12.0 - 15.0 g/dL 88.0  88.7  88.3   Hematocrit 36.0 - 46.0 % 35.0  33.9  34.2   Platelets 150 - 400 K/uL 170  160  167  Latest Ref Rng & Units 04/17/2024    8:30 AM 11/01/2015   10:02  AM  CMP  Glucose 70 - 99 mg/dL 87  898   BUN 8 - 23 mg/dL 18  18   Creatinine 9.55 - 1.00 mg/dL 9.02  9.16   Sodium 864 - 145 mmol/L 139  140   Potassium 3.5 - 5.1 mmol/L 3.7  3.3   Chloride 98 - 111 mmol/L 100  102   CO2 22 - 32 mmol/L 28  27   Calcium 8.9 - 10.3 mg/dL 9.3  9.7   Total Protein 6.5 - 8.1 g/dL 6.7    Total Bilirubin 0.0 - 1.2 mg/dL 0.5    Alkaline Phos 38 - 126 U/L 87    AST 15 - 41 U/L 22    ALT 0 - 44 U/L 13        RADIOGRAPHIC STUDIES: I have personally reviewed the radiological images as listed and agreed with the findings in the report. No results found.

## 2024-08-31 ENCOUNTER — Ambulatory Visit: Payer: Medicare (Managed Care)

## 2024-08-31 DIAGNOSIS — R634 Abnormal weight loss: Secondary | ICD-10-CM | POA: Diagnosis not present

## 2024-08-31 DIAGNOSIS — D509 Iron deficiency anemia, unspecified: Secondary | ICD-10-CM | POA: Diagnosis not present
